# Patient Record
Sex: Male | Born: 1970
Health system: Southern US, Community
[De-identification: ages and names within clinical notes are randomized; demographics above are authoritative.]

## PROBLEM LIST (undated history)

## (undated) DIAGNOSIS — N200 Calculus of kidney: Secondary | ICD-10-CM

## (undated) DIAGNOSIS — I82409 Acute embolism and thrombosis of unspecified deep veins of unspecified lower extremity: Secondary | ICD-10-CM

## (undated) DIAGNOSIS — I2699 Other pulmonary embolism without acute cor pulmonale: Secondary | ICD-10-CM

## (undated) DIAGNOSIS — R7303 Prediabetes: Secondary | ICD-10-CM

## (undated) DIAGNOSIS — E785 Hyperlipidemia, unspecified: Secondary | ICD-10-CM

## (undated) DIAGNOSIS — K279 Peptic ulcer, site unspecified, unspecified as acute or chronic, without hemorrhage or perforation: Secondary | ICD-10-CM

## (undated) DIAGNOSIS — Z87442 Personal history of urinary calculi: Secondary | ICD-10-CM

## (undated) DIAGNOSIS — G4730 Sleep apnea, unspecified: Secondary | ICD-10-CM

## (undated) HISTORY — DX: Other pulmonary embolism without acute cor pulmonale: I26.99

## (undated) HISTORY — DX: Acute embolism and thrombosis of unspecified deep veins of unspecified lower extremity: I82.409

## (undated) HISTORY — DX: Peptic ulcer, site unspecified, unspecified as acute or chronic, without hemorrhage or perforation: K27.9

## (undated) HISTORY — DX: Sleep apnea, unspecified: G47.30

## (undated) HISTORY — DX: Hyperlipidemia, unspecified: E78.5

## (undated) HISTORY — DX: Calculus of kidney: N20.0

## (undated) HISTORY — PX: TONSILLECTOMY: SHX5217

## (undated) HISTORY — PX: TONSILLECTOMY: SUR1361

---

## 1997-09-30 ENCOUNTER — Other Ambulatory Visit: Admission: RE | Admit: 1997-09-30 | Discharge: 1997-09-30 | Payer: Self-pay | Admitting: *Deleted

## 2000-09-21 ENCOUNTER — Encounter: Payer: Self-pay | Admitting: Family Medicine

## 2001-07-17 ENCOUNTER — Ambulatory Visit (HOSPITAL_BASED_OUTPATIENT_CLINIC_OR_DEPARTMENT_OTHER): Admission: RE | Admit: 2001-07-17 | Discharge: 2001-07-17 | Payer: Self-pay | Admitting: Family Medicine

## 2007-07-03 ENCOUNTER — Encounter: Payer: Self-pay | Admitting: Family Medicine

## 2007-07-09 ENCOUNTER — Encounter: Payer: Self-pay | Admitting: Family Medicine

## 2009-04-01 ENCOUNTER — Ambulatory Visit: Payer: Self-pay | Admitting: Family Medicine

## 2009-04-01 DIAGNOSIS — E785 Hyperlipidemia, unspecified: Secondary | ICD-10-CM

## 2009-04-01 HISTORY — DX: Hyperlipidemia, unspecified: E78.5

## 2009-04-03 LAB — CONVERTED CEMR LAB
ALT: 26 units/L (ref 0–53)
AST: 22 units/L (ref 0–37)
Albumin: 4.3 g/dL (ref 3.5–5.2)
Eosinophils Relative: 2 % (ref 0.0–5.0)
GFR calc non Af Amer: 114.26 mL/min (ref >60)
HCT: 48.1 % (ref 39.0–52.0)
HDL: 37.9 mg/dL — ABNORMAL LOW (ref >39.00)
Hemoglobin: 16.1 g/dL (ref 13.0–17.0)
Lymphs Abs: 2.2 10*3/uL (ref 0.7–4.0)
Monocytes Relative: 7.9 % (ref 3.0–12.0)
Neutro Abs: 3.2 10*3/uL (ref 1.4–7.7)
Potassium: 4.2 meq/L (ref 3.5–5.1)
RBC: 5.44 M/uL (ref 4.22–5.81)
Sodium: 140 meq/L (ref 135–145)
TSH: 1.41 microintl units/mL (ref 0.35–5.50)
VLDL: 31 mg/dL (ref 0.0–40.0)
WBC: 6 10*3/uL (ref 4.5–10.5)

## 2009-06-02 ENCOUNTER — Encounter: Payer: Self-pay | Admitting: Family Medicine

## 2009-12-03 ENCOUNTER — Emergency Department (HOSPITAL_BASED_OUTPATIENT_CLINIC_OR_DEPARTMENT_OTHER): Admission: EM | Admit: 2009-12-03 | Discharge: 2009-12-03 | Payer: Self-pay | Admitting: Emergency Medicine

## 2010-04-06 NOTE — Letter (Signed)
Summary: [facility]   Imported By: Maryln Gottron 04/02/2009 15:01:10  _____________________________________________________________________  External Attachment:    Type:   Image     Comment:   External Document

## 2010-04-06 NOTE — Medication Information (Signed)
Summary: Order for CPAP Supplies/[facility]  Order for CPAP Supplies/[facility]   Imported By: Maryln Gottron 06/04/2009 10:44:52  _____________________________________________________________________  External Attachment:    Type:   Image     Comment:   External Document

## 2010-04-06 NOTE — Assessment & Plan Note (Signed)
Summary: NEW PT TO [facility]/TO ESTABLISH/PT REQ CPX/PT FASTING/[facility]   Vital Signs:  Patient profile:   40 year old male Height:      70.25 inches Weight:      250 pounds BMI:     35.75 Temp:     98.6 degrees F oral Pulse rate:   80 / minute Pulse rhythm:   regular Resp:     12 per minute BP sitting:   110 / 80  (left arm) Cuff size:   large  Vitals Entered By: Sid Falcon LPN (April 01, 2009 9:11 AM) CC: New pt to establish Is Patient Diabetic? No   History of Present Illness: New patient to establish care and for complete physical examination. Patient has no significant chronic medical problems other than obstructive sleep apnea. History of dyslipidemia with predominantly low HDL and remote history of kidney stones.  History tonsillectomy 1979. Allergy to penicillin. Takes no regular medications.  Family history significant for father and grandfather having diabetes and coronary artery disease. Hypertension several members. Grandmother with colon cancer. Sister with hypothyroidism. Aunt with melanoma.    Patient is married and has 2 children ages 74 and 65. Wife is a Chartered loss adjuster. Patient works with [employer] and has recently discovered that he may be losing his job. Has just recently started exercise.  Preventive Screening-Counseling & Management  Alcohol-Tobacco     Smoking Status: never  Caffeine-Diet-Exercise     Does Patient Exercise: no  Allergies (verified): 1)  Penicillin V Potassium (Penicillin V Potassium)  Past History:  Past Medical History:  Hyperlipidemia Kidney stones Sleep Apnea  Past Surgical History: Tonsillectomy38  Family History: Family History of Alcoholism/Addiction, father, grandfather,uncle colon cancer, grandmother prostate cancer, grandfather Family History High cholesterol, father & mother Heart disease, grandfather, father 5447s) Family History Hypertension, father, grandfather Sudden death, stroke, grandfather Diabetes,  father, grandfather Alzheimers, grandmother Hypothyroid, sister Melanoma, aunt  Social History: Occupation:  Emergency planning/management officer Married Never Smoked Alcohol use-no Regular exercise-no Smoking Status:  never Occupation:  employed Does Patient Exercise:  no  Review of Systems       The patient complains of weight gain.  The patient denies anorexia, fever, weight loss, vision loss, decreased hearing, hoarseness, chest pain, syncope, dyspnea on exertion, peripheral edema, prolonged cough, headaches, hemoptysis, abdominal pain, melena, hematochezia, severe indigestion/heartburn, hematuria, incontinence, genital sores, muscle weakness, suspicious skin lesions, transient blindness, depression, unusual weight change, enlarged lymph nodes, and testicular masses.         occasional lightheadedness when he first stands up no syncope.  Physical Exam  General:  Well-developed,well-nourished,in no acute distress; alert,appropriate and cooperative throughout examination Head:  Normocephalic and atraumatic without obvious abnormalities. No apparent alopecia or balding. Eyes:  No corneal or conjunctival inflammation noted. EOMI. Perrla. Funduscopic exam benign, without hemorrhages, exudates or papilledema. Vision grossly normal. Ears:  External ear exam shows no significant lesions or deformities.  Otoscopic examination reveals clear canals, tympanic membranes are intact bilaterally without bulging, retraction, inflammation or discharge. Hearing is grossly normal bilaterally. Nose:  External nasal examination shows no deformity or inflammation. Nasal mucosa are pink and moist without lesions or exudates. Mouth:  Oral mucosa and oropharynx without lesions or exudates.  Teeth in good repair. Neck:  No deformities, masses, or tenderness noted. Lungs:  Normal respiratory effort, chest expands symmetrically. Lungs are clear to auscultation, no crackles or wheezes. Heart:  Normal rate and regular rhythm. S1 and  S2 normal without gallop, murmur, click, rub or other extra sounds.  Abdomen:  Bowel sounds positive,abdomen soft and non-tender without masses, organomegaly or hernias noted. Genitalia:  Testes bilaterally descended without nodularity, tenderness or masses. No scrotal masses or lesions. No penis lesions or urethral discharge. Msk:  No deformity or scoliosis noted of thoracic or lumbar spine.   Extremities:  No clubbing, cyanosis, edema, or deformity noted with normal full range of motion of all joints.   Neurologic:  No cranial nerve deficits noted. Station and gait are normal. Plantar reflexes are down-going bilaterally. DTRs are symmetrical throughout. Sensory, motor and coordinative functions appear intact. Skin:  several moles on his back but no sig  atypical features. Cervical Nodes:  No lymphadenopathy noted Psych:  Cognition and judgment appear intact. Alert and cooperative with normal attention span and concentration. No apparent delusions, illusions, hallucinations   Impression & Recommendations:  Problem # 1:  Preventive Health Care (ICD-V70.0) patient is to work on weight loss. Immunizations up to date. Obtain screening lab work. Start regular exercise.  Other Orders: TLB-Lipid Panel (80061-LIPID) TLB-BMP (Basic Metabolic Panel-BMET) (80048-METABOL) TLB-CBC Platelet - w/Differential (85025-CBCD) TLB-Hepatic/Liver Function Pnl (80076-HEPATIC) TLB-TSH (Thyroid Stimulating Hormone) (84443-TSH) Venipuncture (25427)  Patient Instructions: 1)  It is important that you exercise reguarly at least 20 minutes 5 times a week. If you develop chest pain, have severe difficulty breathing, or feel very tired, stop exercising immediately and seek medical attention.  2)  You need to lose weight. Consider a lower calorie diet and regular exercise.   Preventive Care Screening  Last Tetanus Booster:    Date:  03/07/2002    Results:  Historical      Colonscopy 1995 (Hx  Ulcer)    Immunization History:  Hepatitis A Immunization History:    Hepatitis A # 1:  historical (03/08/1999)    Hepatitis A # 2:  historical (03/07/2002)

## 2010-05-20 LAB — URINE MICROSCOPIC-ADD ON

## 2010-05-20 LAB — URINALYSIS, ROUTINE W REFLEX MICROSCOPIC
Bilirubin Urine: NEGATIVE
Glucose, UA: NEGATIVE mg/dL
Ketones, ur: NEGATIVE mg/dL
Leukocytes, UA: NEGATIVE
Nitrite: NEGATIVE
Protein, ur: NEGATIVE mg/dL
Specific Gravity, Urine: 1.024 (ref 1.005–1.030)
Urobilinogen, UA: 0.2 mg/dL (ref 0.0–1.0)
pH: 5.5 (ref 5.0–8.0)

## 2010-08-26 ENCOUNTER — Other Ambulatory Visit: Payer: Self-pay

## 2010-09-02 ENCOUNTER — Other Ambulatory Visit (INDEPENDENT_AMBULATORY_CARE_PROVIDER_SITE_OTHER): Payer: BC Managed Care – PPO

## 2010-09-02 DIAGNOSIS — Z0000 Encounter for general adult medical examination without abnormal findings: Secondary | ICD-10-CM

## 2010-09-02 LAB — HEPATIC FUNCTION PANEL
Bilirubin, Direct: 0.1 mg/dL (ref 0.0–0.3)
Total Bilirubin: 0.6 mg/dL (ref 0.3–1.2)

## 2010-09-02 LAB — BASIC METABOLIC PANEL
CO2: 29 mEq/L (ref 19–32)
Calcium: 9.1 mg/dL (ref 8.4–10.5)
GFR: 118.55 mL/min (ref >60.00)
Sodium: 137 mEq/L (ref 135–145)

## 2010-09-02 LAB — CBC WITH DIFFERENTIAL/PLATELET
Basophils Relative: 0.6 % (ref 0.0–3.0)
Eosinophils Relative: 1.8 % (ref 0.0–5.0)
Hemoglobin: 16 g/dL (ref 13.0–17.0)
Lymphocytes Relative: 38.5 % (ref 12.0–46.0)
MCHC: 34.7 g/dL (ref 30.0–36.0)
Monocytes Relative: 6.2 % (ref 3.0–12.0)
Neutro Abs: 3.3 10*3/uL (ref 1.4–7.7)
RBC: 5.31 Mil/uL (ref 4.22–5.81)
WBC: 6.2 10*3/uL (ref 4.5–10.5)

## 2010-09-02 LAB — LIPID PANEL
HDL: 32.3 mg/dL — ABNORMAL LOW (ref >39.00)
Total CHOL/HDL Ratio: 6
Triglycerides: 325 mg/dL — ABNORMAL HIGH (ref 0.0–149.0)
VLDL: 65 mg/dL — ABNORMAL HIGH (ref 0.0–40.0)

## 2010-09-02 LAB — POCT URINALYSIS DIPSTICK
Nitrite, UA: NEGATIVE
Urobilinogen, UA: 0.2
pH, UA: 6

## 2010-09-02 LAB — TSH: TSH: 1.56 u[IU]/mL (ref 0.35–5.50)

## 2010-09-03 ENCOUNTER — Encounter: Payer: Self-pay | Admitting: Family Medicine

## 2010-09-09 ENCOUNTER — Encounter: Payer: Self-pay | Admitting: Family Medicine

## 2010-09-10 ENCOUNTER — Encounter: Payer: Self-pay | Admitting: Family Medicine

## 2010-09-13 ENCOUNTER — Encounter: Payer: Self-pay | Admitting: Family Medicine

## 2010-09-13 ENCOUNTER — Ambulatory Visit (INDEPENDENT_AMBULATORY_CARE_PROVIDER_SITE_OTHER): Payer: BC Managed Care – PPO | Admitting: Family Medicine

## 2010-09-13 VITALS — BP 122/82 | HR 80 | Temp 98.0°F | Resp 12 | Ht 70.0 in | Wt 250.0 lb

## 2010-09-13 DIAGNOSIS — Z0000 Encounter for general adult medical examination without abnormal findings: Secondary | ICD-10-CM

## 2010-09-13 DIAGNOSIS — Z23 Encounter for immunization: Secondary | ICD-10-CM

## 2010-09-13 NOTE — Patient Instructions (Signed)
Work on weight loss and more consistent exercise Reduce sugars and starches Consider omega-3 supplement such as fish oil 2-3 g daily.

## 2010-09-13 NOTE — Progress Notes (Signed)
  Subjective:    Patient ID: Scott Mcconnell, male    DOB: 05-22-1970, 40 y.o.   MRN: [id number]  HPI Patient seen for well visit. He has no chronic problems other than some dyslipidemia with mostly high triglycerides. Takes no medications. Allergy to penicillin. Last tetanus 2004. No history of pertussis booster to our knowledge. He has obstructive sleep apnea and uses CPAP for that. No consistent exercise. Also past history of kidney stones.  Past Medical History  Diagnosis Date  . Hyperlipidemia   . Kidney stones   . Sleep apnea   . HYPERLIPIDEMIA 04/01/2009  . Sleep apnea   . Kidney stones    Past Surgical History  Procedure Date  . Tonsillectomy   . Tonsillectomy     reports that he has never smoked. He does not have any smokeless tobacco history on file. He reports that he does not drink alcohol. His drug history not on file. family history includes Alcohol abuse in his father, paternal grandfather, and paternal uncle; Alzheimer's disease in an unspecified family member; Cancer in an unspecified family member; Diabetes in his father; Heart disease in his father; Hyperlipidemia in his father and mother; Hypertension in his father; Hypothyroidism in his sister; Stroke in an unspecified family member; and Sudden death in an unspecified family member. Allergies  Allergen Reactions  . Penicillins     As a child      Review of Systems  Constitutional: Negative for fever, activity change, appetite change and fatigue.  HENT: Negative for ear pain, congestion and trouble swallowing.   Eyes: Negative for pain and visual disturbance.  Respiratory: Negative for cough, shortness of breath and wheezing.   Cardiovascular: Negative for chest pain and palpitations.  Gastrointestinal: Negative for nausea, vomiting, abdominal pain, diarrhea, constipation, blood in stool, abdominal distention and rectal pain.  Genitourinary: Negative for dysuria, hematuria and testicular pain.  Musculoskeletal:  Negative for joint swelling and arthralgias.  Skin: Negative for rash.  Neurological: Negative for dizziness, syncope and headaches.  Hematological: Negative for adenopathy.  Psychiatric/Behavioral: Negative for confusion and dysphoric mood.       Objective:   Physical Exam  Constitutional: He is oriented to person, place, and time. He appears well-developed and well-nourished. No distress.  HENT:  Head: Normocephalic and atraumatic.  Right Ear: External ear normal.  Left Ear: External ear normal.  Mouth/Throat: Oropharynx is clear and moist.  Eyes: Conjunctivae and EOM are normal. Pupils are equal, round, and reactive to light.  Neck: Normal range of motion. Neck supple. No thyromegaly present.  Cardiovascular: Normal rate, regular rhythm and normal heart sounds.   No murmur heard. Pulmonary/Chest: No respiratory distress. He has no wheezes. He has no rales.  Abdominal: Soft. Bowel sounds are normal. He exhibits no distension and no mass. There is no tenderness. There is no rebound and no guarding.  Musculoskeletal: He exhibits no edema.  Lymphadenopathy:    He has no cervical adenopathy.  Neurological: He is alert and oriented to person, place, and time. He displays normal reflexes. No cranial nerve deficit.  Skin: No rash noted.  Psychiatric: He has a normal mood and affect.          Assessment & Plan:  Health maintenance. Tetanus booster given. He has hypertriglyceridemia. Recommended supplement with fish oil and reduction of weight and sugars and starches. Establish more regular exercise.  He has substantial risk for metabolic syndrome and very high risk for eventual diabetes. Prevention issues discussed

## 2011-12-02 ENCOUNTER — Other Ambulatory Visit (INDEPENDENT_AMBULATORY_CARE_PROVIDER_SITE_OTHER): Payer: 59

## 2011-12-02 DIAGNOSIS — Z0000 Encounter for general adult medical examination without abnormal findings: Secondary | ICD-10-CM

## 2011-12-02 LAB — BASIC METABOLIC PANEL
BUN: 11 mg/dL (ref 6–23)
Calcium: 9.1 mg/dL (ref 8.4–10.5)
GFR: 108.06 mL/min (ref >60.00)
Glucose, Bld: 86 mg/dL (ref 70–99)
Potassium: 4.1 mEq/L (ref 3.5–5.1)

## 2011-12-02 LAB — CBC WITH DIFFERENTIAL/PLATELET
Basophils Absolute: 0 10*3/uL (ref 0.0–0.1)
Eosinophils Absolute: 0.1 10*3/uL (ref 0.0–0.7)
HCT: 48.7 % (ref 39.0–52.0)
Lymphs Abs: 2.4 10*3/uL (ref 0.7–4.0)
MCHC: 32.8 g/dL (ref 30.0–36.0)
Monocytes Relative: 7 % (ref 3.0–12.0)
Platelets: 227 10*3/uL (ref 150.0–400.0)
RDW: 12.8 % (ref 11.5–14.6)

## 2011-12-02 LAB — LDL CHOLESTEROL, DIRECT: Direct LDL: 133.1 mg/dL

## 2011-12-02 LAB — POCT URINALYSIS DIPSTICK
Bilirubin, UA: NEGATIVE
Blood, UA: NEGATIVE
Glucose, UA: NEGATIVE
Ketones, UA: NEGATIVE
Spec Grav, UA: 1.02
Urobilinogen, UA: 0.2

## 2011-12-02 LAB — HEPATIC FUNCTION PANEL
ALT: 24 U/L (ref 0–53)
AST: 18 U/L (ref 0–37)
Albumin: 4.1 g/dL (ref 3.5–5.2)
Alkaline Phosphatase: 43 U/L (ref 39–117)
Bilirubin, Direct: 0 mg/dL (ref 0.0–0.3)
Total Bilirubin: 0.8 mg/dL (ref 0.3–1.2)
Total Protein: 7.6 g/dL (ref 6.0–8.3)

## 2011-12-02 LAB — LIPID PANEL
Cholesterol: 220 mg/dL — ABNORMAL HIGH (ref 0–200)
VLDL: 40.2 mg/dL — ABNORMAL HIGH (ref 0.0–40.0)

## 2011-12-02 LAB — TSH: TSH: 2.48 u[IU]/mL (ref 0.35–5.50)

## 2011-12-09 ENCOUNTER — Encounter: Payer: BC Managed Care – PPO | Admitting: Family Medicine

## 2011-12-12 ENCOUNTER — Encounter: Payer: Self-pay | Admitting: Family Medicine

## 2011-12-12 ENCOUNTER — Ambulatory Visit (INDEPENDENT_AMBULATORY_CARE_PROVIDER_SITE_OTHER): Payer: 59 | Admitting: Family Medicine

## 2011-12-12 VITALS — BP 110/80 | HR 72 | Temp 98.2°F | Resp 12 | Ht 70.5 in | Wt 254.0 lb

## 2011-12-12 DIAGNOSIS — Z0000 Encounter for general adult medical examination without abnormal findings: Secondary | ICD-10-CM

## 2011-12-12 DIAGNOSIS — Z23 Encounter for immunization: Secondary | ICD-10-CM

## 2011-12-12 NOTE — Patient Instructions (Addendum)
Continue regular exercise and consider increasing intensity of exercise as discussed.

## 2011-12-12 NOTE — Progress Notes (Signed)
Subjective:    Patient ID: Scott Mcconnell, male    DOB: 1970/08/05, 41 y.o.   MRN: [id number]  HPI  Patient seen for complete physical. He has history of obesity and hyperlipidemia. Strong family history of hyperlipidemia, type 2 diabetes, and CAD. Patient has started walking some recently for exercise. No recent chest pains. Weight is essentially unchanged from last year. He takes no medications. Nonsmoker. No history of diabetes. No history of hypertension. Needs flu vaccine.  Past Medical History  Diagnosis Date  . Hyperlipidemia   . Kidney stones   . Sleep apnea   . HYPERLIPIDEMIA 04/01/2009  . Sleep apnea   . Kidney stones    Past Surgical History  Procedure Date  . Tonsillectomy   . Tonsillectomy     reports that he has never smoked. He does not have any smokeless tobacco history on file. He reports that he does not drink alcohol. His drug history not on file. family history includes Alcohol abuse in his father, paternal grandfather, and paternal uncle; Alzheimer's disease in an unspecified family member; Cancer in an unspecified family member; Diabetes in his father; Heart disease (age of onset:55) in his father; Hyperlipidemia in his father and mother; Hypertension in his father; Hypothyroidism in his sister; Stroke in an unspecified family member; and Sudden death in an unspecified family member. Allergies  Allergen Reactions  . Penicillins     As a child      Review of Systems  Constitutional: Negative for fever, activity change, appetite change and fatigue.  HENT: Negative for ear pain, congestion and trouble swallowing.   Eyes: Negative for pain and visual disturbance.  Respiratory: Negative for cough, shortness of breath and wheezing.   Cardiovascular: Negative for chest pain and palpitations.  Gastrointestinal: Negative for nausea, vomiting, abdominal pain, diarrhea, constipation, blood in stool, abdominal distention and rectal pain.  Genitourinary: Negative for  dysuria, hematuria and testicular pain.  Musculoskeletal: Negative for joint swelling and arthralgias.       Patient's had some right lateral elbow pain. Job requires frequent typing. Location is lateral epicondylar region. Pain with gripping. No weakness. No visible swelling  Skin: Negative for rash.  Neurological: Negative for dizziness, syncope and headaches.  Hematological: Negative for adenopathy.  Psychiatric/Behavioral: Negative for confusion and dysphoric mood.       Objective:   Physical Exam  Constitutional: He is oriented to person, place, and time. He appears well-developed and well-nourished. No distress.  HENT:  Head: Normocephalic and atraumatic.  Right Ear: External ear normal.  Left Ear: External ear normal.  Mouth/Throat: Oropharynx is clear and moist.  Eyes: Conjunctivae normal and EOM are normal. Pupils are equal, round, and reactive to light.  Neck: Normal range of motion. Neck supple. No thyromegaly present.  Cardiovascular: Normal rate, regular rhythm and normal heart sounds.   No murmur heard. Pulmonary/Chest: No respiratory distress. He has no wheezes. He has no rales.  Abdominal: Soft. Bowel sounds are normal. He exhibits no distension and no mass. There is no tenderness. There is no rebound and no guarding.  Musculoskeletal: He exhibits no edema.       Full range of motion right elbow. Tender right lateral epicondylar region  Lymphadenopathy:    He has no cervical adenopathy.  Neurological: He is alert and oriented to person, place, and time. He displays normal reflexes. No cranial nerve deficit.  Skin: No rash noted.  Psychiatric: He has a normal mood and affect.  Assessment & Plan:  Complete physical. Labs reviewed with patient. Low HDL and mildly elevated LDL. Probable metabolic syndrome with elevated triglycerides, low HDL, and increased waist circumference. Discussed strategies for weight loss. Step up his exercise. Flu vaccine  given  Right lateral epicondylitis.  Pt will try icing and tennis elbow strap.

## 2012-02-08 ENCOUNTER — Other Ambulatory Visit: Payer: Self-pay | Admitting: Family Medicine

## 2012-02-08 DIAGNOSIS — G4733 Obstructive sleep apnea (adult) (pediatric): Secondary | ICD-10-CM | POA: Insufficient documentation

## 2012-02-08 NOTE — Telephone Encounter (Signed)
Pt has obstructive sleep apnea 327.23

## 2012-02-08 NOTE — Telephone Encounter (Signed)
Rx written, problem list does not include a diagnosis for CPAP.  I will need code for insurance purposes. Please assist

## 2012-02-08 NOTE — Telephone Encounter (Signed)
Pt needs new CPaP machine, and all supplies that go with it. (mask, hose). (Has 42 yr old one now.) Pt insurance's com. said they will pay.  [facility] needs a script. Pt would like to use [facility] at [address], [place], [place] [phone])

## 2012-02-09 NOTE — Telephone Encounter (Signed)
Rx faxed to [facility], [phone]

## 2012-11-12 ENCOUNTER — Other Ambulatory Visit (INDEPENDENT_AMBULATORY_CARE_PROVIDER_SITE_OTHER): Payer: 59

## 2012-11-12 DIAGNOSIS — Z0000 Encounter for general adult medical examination without abnormal findings: Secondary | ICD-10-CM

## 2012-11-12 LAB — LIPID PANEL
Cholesterol: 223 mg/dL — ABNORMAL HIGH (ref 0–200)
HDL: 38.9 mg/dL — ABNORMAL LOW (ref >39.00)
Total CHOL/HDL Ratio: 6
VLDL: 52.8 mg/dL — ABNORMAL HIGH (ref 0.0–40.0)

## 2012-11-12 LAB — CBC WITH DIFFERENTIAL/PLATELET
Basophils Relative: 0.6 % (ref 0.0–3.0)
Eosinophils Relative: 2 % (ref 0.0–5.0)
Lymphocytes Relative: 38.6 % (ref 12.0–46.0)
MCV: 84.1 fl (ref 78.0–100.0)
Monocytes Absolute: 0.4 10*3/uL (ref 0.1–1.0)
Monocytes Relative: 6.9 % (ref 3.0–12.0)
Neutrophils Relative %: 51.9 % (ref 43.0–77.0)
Platelets: 239 10*3/uL (ref 150.0–400.0)
RBC: 5.8 Mil/uL (ref 4.22–5.81)
WBC: 6.3 10*3/uL (ref 4.5–10.5)

## 2012-11-12 LAB — HEPATIC FUNCTION PANEL
ALT: 26 U/L (ref 0–53)
Alkaline Phosphatase: 43 U/L (ref 39–117)
Bilirubin, Direct: 0.1 mg/dL (ref 0.0–0.3)
Total Bilirubin: 0.9 mg/dL (ref 0.3–1.2)
Total Protein: 7.8 g/dL (ref 6.0–8.3)

## 2012-11-12 LAB — BASIC METABOLIC PANEL
BUN: 14 mg/dL (ref 6–23)
Calcium: 9.3 mg/dL (ref 8.4–10.5)
Chloride: 102 mEq/L (ref 96–112)
Creatinine, Ser: 0.8 mg/dL (ref 0.4–1.5)
GFR: 119.08 mL/min (ref >60.00)

## 2012-11-12 LAB — POCT URINALYSIS DIPSTICK
Bilirubin, UA: NEGATIVE
Ketones, UA: NEGATIVE
Leukocytes, UA: NEGATIVE
Spec Grav, UA: 1.025
pH, UA: 6

## 2012-11-26 ENCOUNTER — Ambulatory Visit (INDEPENDENT_AMBULATORY_CARE_PROVIDER_SITE_OTHER): Payer: 59 | Admitting: Family Medicine

## 2012-11-26 ENCOUNTER — Encounter: Payer: Self-pay | Admitting: Family Medicine

## 2012-11-26 VITALS — BP 120/80 | HR 85 | Temp 98.3°F | Ht 70.0 in | Wt 258.0 lb

## 2012-11-26 DIAGNOSIS — E669 Obesity, unspecified: Secondary | ICD-10-CM | POA: Insufficient documentation

## 2012-11-26 DIAGNOSIS — Z23 Encounter for immunization: Secondary | ICD-10-CM

## 2012-11-26 DIAGNOSIS — Z0000 Encounter for general adult medical examination without abnormal findings: Secondary | ICD-10-CM

## 2012-11-26 MED ORDER — MOMETASONE FUROATE 0.1 % EX SOLN: Freq: Every day | CUTANEOUS | Status: DC

## 2012-11-26 NOTE — Progress Notes (Signed)
Subjective:    Patient ID: Scott Mcconnell, male    DOB: [birth year], 42 y.o.   MRN: [id number]  HPI Patient here for complete physical Has history of obesity, dyslipidemia, and obstructive sleep apnea. Uses CPAP regularly. Denies any problems with this. Unfortunately, just lost his job recently. Dealing with stress issues with that. Currently not exercising. Weight is stable.  Intermittent right low back pain. No consistent radiculopathy symptoms. No numbness or weakness. Recurrent problems with left ear itching. No drainage  Past Medical History  Diagnosis Date  . Hyperlipidemia   . Kidney stones   . Sleep apnea   . HYPERLIPIDEMIA 04/01/2009  . Sleep apnea   . Kidney stones    Past Surgical History  Procedure Laterality Date  . Tonsillectomy    . Tonsillectomy      reports that he has never smoked. He does not have any smokeless tobacco history on file. He reports that he does not drink alcohol. His drug history is not on file. family history includes Alcohol abuse in his father, paternal grandfather, and paternal uncle; Alzheimer's disease in an other family member; Cancer in an other family member; Diabetes in his father; Heart disease (age of onset: 85) in his father; Hyperlipidemia in his father and mother; Hypertension in his father; Hypothyroidism in his sister; Stroke in an other family member; Sudden death in an other family member. Allergies  Allergen Reactions  . Penicillins     As a child      Review of Systems  Constitutional: Negative for fever, activity change, appetite change and fatigue.  HENT: Negative for ear pain, congestion and trouble swallowing.   Eyes: Negative for pain and visual disturbance.  Respiratory: Negative for cough, shortness of breath and wheezing.   Cardiovascular: Negative for chest pain and palpitations.  Gastrointestinal: Negative for nausea, vomiting, abdominal pain, diarrhea, constipation, blood in stool, abdominal distention and  rectal pain.  Genitourinary: Negative for dysuria, hematuria and testicular pain.  Musculoskeletal: Positive for back pain. Negative for myalgias, joint swelling and arthralgias.  Skin: Negative for rash.  Neurological: Negative for dizziness, syncope and headaches.  Hematological: Negative for adenopathy.  Psychiatric/Behavioral: Negative for confusion and dysphoric mood.       Objective:   Physical Exam  Constitutional: He is oriented to person, place, and time. He appears well-developed and well-nourished. No distress.  HENT:  Right Ear: External ear normal.  Mouth/Throat: Oropharynx is clear and moist.  Left canal reveals no cerumen. Minimal scaling and minimal erythema  Eyes: Pupils are equal, round, and reactive to light.  Neck: Neck supple. No thyromegaly present.  Cardiovascular: Normal rate and regular rhythm.   Pulmonary/Chest: Effort normal and breath sounds normal. No respiratory distress. He has no wheezes. He has no rales.  Abdominal: Soft. Bowel sounds are normal. There is no tenderness. There is no rebound and no guarding.  Patient has fairly large ventral hernia which is nontender. Small umbilical hernia which also is nontender  Genitourinary:  Testes are normal  Musculoskeletal: He exhibits no edema.  Lymphadenopathy:    He has no cervical adenopathy.  Neurological: He is alert and oriented to person, place, and time. No cranial nerve deficit.  Skin: Skin is warm and dry. No rash noted. No erythema. No pallor.  Psychiatric: He has a normal mood and affect. His behavior is normal. Judgment and thought content normal.          Assessment & Plan:  Complete physical. Labs reviewed. We've recommended weight  loss and discussed strategies. Flu vaccine given. He has mild eczema involving left ear canal. Elocon lotion once daily as needed. We talked about effective strategies for dealing with stress

## 2012-11-26 NOTE — Patient Instructions (Addendum)
Ventral Hernia A ventral hernia (also called an incisional hernia) is a hernia that occurs at the site of a previous surgical cut (incision) in the abdomen. The abdominal wall spans from your lower chest down to your pelvis. If the abdominal wall is weakened from a surgical incision, a hernia can occur. A hernia is a bulge of bowel or muscle tissue pushing out on the weakened part of the abdominal wall. Ventral hernias can get bigger from straining or lifting. Obese and older people are at higher risk for a ventral hernia. People who develop infections after surgery or require repeat incisions at the same site on the abdomen are also at increased risk. CAUSES  A ventral hernia occurs because of weakness in the abdominal wall at an incision site.  SYMPTOMS  Common symptoms include:  A visible bulge or lump on the abdominal wall.  Pain or tenderness around the lump.  Increased discomfort if you cough or make a sudden movement. If the hernia has blocked part of the intestine, a serious complication can occur (incarcerated or strangulated hernia). This can become a problem that requires emergency surgery because the blood flow to the blocked intestine may be cut off. Symptoms may include:  Feeling sick to your stomach (nauseous).  Throwing up (vomiting).  Stomach swelling (distention) or bloating.  Fever.  Rapid heartbeat. DIAGNOSIS  Your caregiver will take a medical history and perform a physical exam. Various tests may be ordered, such as:  Blood tests.  Urine tests.  Ultrasonography.  X-rays.  Computed tomography (CT). TREATMENT  Watchful waiting may be all that is needed for a smaller hernia that does not cause symptoms. Your caregiver may recommend the use of a supportive belt (truss) that helps to keep the abdominal wall intact. For larger hernias or those that cause pain, surgery to repair the hernia is usually recommended. If a hernia becomes strangulated, emergency surgery  needs to be done right away. HOME CARE INSTRUCTIONS  Avoid putting pressure or strain on the abdominal area.  Avoid heavy lifting.  Use good body positioning for physical tasks. Ask your caregiver about proper body positioning.  Use a supportive belt as directed by your caregiver.  Maintain a healthy weight.  Eat foods that are high in fiber, such as whole grains, fruits, and vegetables. Fiber helps prevent difficult bowel movements (constipation).  Drink enough fluids to keep your urine clear or pale yellow.  Follow up with your caregiver as directed. SEEK MEDICAL CARE IF:   Your hernia seems to be getting larger or more painful. SEEK IMMEDIATE MEDICAL CARE IF:   You have abdominal pain that is sudden and sharp.  Your pain becomes severe.  You have repeated vomiting.  You are sweating a lot.  You notice a rapid heartbeat.  You develop a fever. MAKE SURE YOU:   Understand these instructions.  Will watch your condition.  Will get help right away if you are not doing well or get worse. Document Released: 02/08/2012 Document Reviewed: 01/27/2012 [employer] Patient Information 2014 [employer].  

## 2012-12-05 ENCOUNTER — Telehealth: Payer: Self-pay

## 2012-12-05 NOTE — Telephone Encounter (Signed)
Patient is calling about his eye, patient was given mometasone (ELOCON) 0.1 % lotion apply once daily. Patient wants to know is he suppose to put the drops in his ear because he thought he was suppose to getting the drops instead of a lotion.

## 2012-12-05 NOTE — Telephone Encounter (Signed)
Please clarify note.  NOT eye.  He is supposed to use in ear.  OK to use lotion-unless this is too thick to run into canal.

## 2012-12-06 NOTE — Telephone Encounter (Signed)
Pt informed

## 2013-11-20 ENCOUNTER — Other Ambulatory Visit (INDEPENDENT_AMBULATORY_CARE_PROVIDER_SITE_OTHER): Payer: BC Managed Care – PPO

## 2013-11-20 DIAGNOSIS — Z0000 Encounter for general adult medical examination without abnormal findings: Secondary | ICD-10-CM

## 2013-11-20 LAB — HEPATIC FUNCTION PANEL
ALBUMIN: 4.3 g/dL (ref 3.5–5.2)
ALK PHOS: 43 U/L (ref 39–117)
ALT: 25 U/L (ref 0–53)
AST: 23 U/L (ref 0–37)
Bilirubin, Direct: 0 mg/dL (ref 0.0–0.3)
TOTAL PROTEIN: 8 g/dL (ref 6.0–8.3)
Total Bilirubin: 1.1 mg/dL (ref 0.2–1.2)

## 2013-11-20 LAB — POCT URINALYSIS DIPSTICK
Bilirubin, UA: NEGATIVE
Blood, UA: NEGATIVE
GLUCOSE UA: NEGATIVE
Ketones, UA: NEGATIVE
Leukocytes, UA: NEGATIVE
NITRITE UA: NEGATIVE
Protein, UA: NEGATIVE
SPEC GRAV UA: 1.015
UROBILINOGEN UA: 0.2
pH, UA: 7

## 2013-11-20 LAB — CBC WITH DIFFERENTIAL/PLATELET
Basophils Absolute: 0 10*3/uL (ref 0.0–0.1)
Basophils Relative: 0.4 % (ref 0.0–3.0)
Eosinophils Absolute: 0.2 10*3/uL (ref 0.0–0.7)
Eosinophils Relative: 2.7 % (ref 0.0–5.0)
HCT: 48.3 % (ref 39.0–52.0)
Hemoglobin: 16.5 g/dL (ref 13.0–17.0)
LYMPHS PCT: 38.2 % (ref 12.0–46.0)
Lymphs Abs: 2.4 10*3/uL (ref 0.7–4.0)
MCHC: 34.3 g/dL (ref 30.0–36.0)
MCV: 85.6 fl (ref 78.0–100.0)
Monocytes Absolute: 0.4 10*3/uL (ref 0.1–1.0)
Monocytes Relative: 5.8 % (ref 3.0–12.0)
NEUTROS PCT: 52.9 % (ref 43.0–77.0)
Neutro Abs: 3.3 10*3/uL (ref 1.4–7.7)
Platelets: 234 10*3/uL (ref 150.0–400.0)
RBC: 5.64 Mil/uL (ref 4.22–5.81)
RDW: 13.1 % (ref 11.5–15.5)
WBC: 6.3 10*3/uL (ref 4.0–10.5)

## 2013-11-20 LAB — BASIC METABOLIC PANEL
BUN: 15 mg/dL (ref 6–23)
CO2: 30 mEq/L (ref 19–32)
Calcium: 9.2 mg/dL (ref 8.4–10.5)
Chloride: 103 mEq/L (ref 96–112)
Creatinine, Ser: 1.1 mg/dL (ref 0.4–1.5)
GFR: 80.73 mL/min (ref >60.00)
Glucose, Bld: 98 mg/dL (ref 70–99)
POTASSIUM: 4.9 meq/L (ref 3.5–5.1)
SODIUM: 140 meq/L (ref 135–145)

## 2013-11-20 LAB — LIPID PANEL
CHOLESTEROL: 206 mg/dL — AB (ref 0–200)
HDL: 32.9 mg/dL — AB (ref >39.00)
NonHDL: 173.1
TRIGLYCERIDES: 221 mg/dL — AB (ref 0.0–149.0)
Total CHOL/HDL Ratio: 6
VLDL: 44.2 mg/dL — AB (ref 0.0–40.0)

## 2013-11-20 LAB — TSH: TSH: 0.44 u[IU]/mL (ref 0.35–4.50)

## 2013-11-21 LAB — LDL CHOLESTEROL, DIRECT: LDL DIRECT: 145 mg/dL

## 2013-11-27 ENCOUNTER — Ambulatory Visit (INDEPENDENT_AMBULATORY_CARE_PROVIDER_SITE_OTHER): Payer: BC Managed Care – PPO | Admitting: Family Medicine

## 2013-11-27 ENCOUNTER — Encounter: Payer: Self-pay | Admitting: Family Medicine

## 2013-11-27 VITALS — BP 110/80 | HR 76 | Temp 98.0°F | Ht 70.0 in | Wt 256.0 lb

## 2013-11-27 DIAGNOSIS — Z0000 Encounter for general adult medical examination without abnormal findings: Secondary | ICD-10-CM

## 2013-11-27 DIAGNOSIS — Z23 Encounter for immunization: Secondary | ICD-10-CM

## 2013-11-27 NOTE — Patient Instructions (Signed)
Metabolic Syndrome, Adult Metabolic syndrome describes a group of risk factors for heart disease and diabetes. This syndrome has other names including Insulin Resistance Syndrome. The more risk factors you have, the higher your risk of having a heart attack, stroke, or developing diabetes. These risk factors include:  High blood sugar.  High blood triglyceride (a fat found in the blood) level.  High blood pressure.  Abdominal obesity (your extra weight is around your waist instead of your hips).  Low levels of high-density lipoprotein, HDL (good blood cholesterol). If you have any three of these risk factors, you have metabolic syndrome. If you have even one of these factors, you should make lifestyle changes to improve your health in order to prevent serious health diseases.  In people with metabolic syndrome, the cells do not respond properly to insulin. This can lead to high levels of glucose in the blood, which can interfere with normal body processes. Eventually, this can cause high blood pressure and higher fat levels in the blood, and inflammation of your blood vessels. The result can be heart disease and stroke.  CAUSES   Eating a diet rich in calories and saturated fat.  Too little physical activity.  Being overweight. Other underlying causes are:  Family history (genetics).  Ethnicity (South Asians are at a higher risk).  Older age (your chances of developing metabolic syndrome are higher as you grow older).  Insulin resistance. SYMPTOMS  By itself, metabolic syndrome has no symptoms. However, you might have symptoms of diabetes (high blood sugar) or high blood pressure, such as:  Increased thirst, urination, and tiredness.  Dizzy spells.  Dull headaches that are unusual for you.  Blurred vision.  Nosebleeds. DIAGNOSIS  Your caregiver may make a diagnosis of metabolic syndrome if you have at least three of these factors:  If you are overweight mostly around the  waist. This means a waistline greater than 40" in men and more than 35" in women. The waistline limits are 31 to 35 inches for women and 37 to 39 inches for men. In those who have certain genetic risk factors, such as having a family history of diabetes or being of Asian descent.  If you have a blood pressure of 130/85 mm Hg or more, or if you are being treated for high blood pressure.  If your blood triglyceride level is 150 mg/dL or more, or you are being treated for high levels of triglyceride.  If the level of HDL in your blood is below 40 mg/dL in men, less than 50 mg/dL in women, or you are receiving treatment for low levels of HDL.  If the level of sugar in your blood is high with fasting blood sugar level of 110 mg/dL or more, or you are under treatment for diabetes. TREATMENT  Your caregiver may have you make lifestyle changes, which may include:  Exercise.  Losing weight.  Maintaining a healthy diet.  Quitting smoking. The lifestyle changes listed above are key in reducing your risk for heart disease and stroke. Medicines may also be prescribed to help your body respond to insulin better and to reduce your blood pressure and blood fat levels. Aspirin may be recommended to reduce risks of heart disease or stroke.  HOME CARE INSTRUCTIONS   Exercise.  Measure your waist at regular intervals just above the hipbones after you have breathed out.  Maintain a healthy diet.  Eat fruits, such as apples, oranges, and pears.  Eat vegetables.  Eat legumes, such as kidney   beans, peas, and lentils.  Eat food rich in soluble fiber, such as whole grain cereal, oatmeal, and oat bran.  Use olive or safflower oils and avoid saturated fats.  Eat nuts.  Limit the amount of salt you eat or add to food.  Limit the amount of alcohol you drink.  Include fish in your diet, if possible.  Stop smoking if you are a smoker.  Maintain regular follow-up appointments.  Follow your  caregiver's advice. SEEK MEDICAL CARE IF:   You feel very tired or fatigued.  You develop excessive thirst.  You pass large quantities of urine.  You are putting on weight around your waist rather than losing weight.  You develop headaches over and over again.  You have off-and-on dizzy spells. SEEK IMMEDIATE MEDICAL CARE IF:   You develop nosebleeds.  You develop sudden blurred vision.  You develop sudden dizzy spells.  You develop chest pains, trouble breathing, or feel an abnormal or irregular heart beat.  You have a fainting episode.  You develop any sudden trouble speaking and/or swallowing.  You develop sudden weakness in one arm and/or one leg. MAKE SURE YOU:   Understand these instructions.  Will watch your condition.  Will get help right away if you are not doing well or get worse. Document Released: 05/31/2007 Document Revised: 07/08/2013 Document Reviewed: 05/31/2007 [facility] Patient Information 2015 [place], [place]. This information is not intended to replace advice given to you by your health care provider. Make sure you discuss any questions you have with your health care provider.  Continue with exercise and weight loss efforts.

## 2013-11-27 NOTE — Progress Notes (Signed)
Pre visit review using our clinic review tool, if applicable. No additional management support is needed unless otherwise documented below in the visit note. 

## 2013-11-27 NOTE — Progress Notes (Signed)
Subjective:    Patient ID: Scott Mcconnell, male    DOB: [birth year], 43 y.o.   MRN: [id number]  HPI Patient seen for complete physical. He has obstructive sleep apnea on CPAP. He has metabolic syndrome with high triglycerides, low HDL, and increased waist circumference. He has lost 2 pounds since last year. Started walking more recently. Needs flu vaccine. Tetanus up-to-date. Generally feels well.  Only acute issue is intermittent right foot pain. Mostly third distal metatarsal. Had some pain last week but none over the past few days. No swelling. No change of shoe wear. No injury.  Past Medical History  Diagnosis Date  . Hyperlipidemia   . Kidney stones   . Sleep apnea   . HYPERLIPIDEMIA 04/01/2009  . Sleep apnea   . Kidney stones    Past Surgical History  Procedure Laterality Date  . Tonsillectomy    . Tonsillectomy      reports that he has never smoked. He does not have any smokeless tobacco history on file. He reports that he does not drink alcohol. His drug history is not on file. family history includes Alcohol abuse in his father, paternal grandfather, and paternal uncle; Alzheimer's disease in an other family member; Cancer in an other family member; Diabetes in his father; Heart disease (age of onset: 22) in his father; Hyperlipidemia in his father and mother; Hypertension in his father; Hypothyroidism in his sister; Stroke in an other family member; Sudden death in an other family member. Allergies  Allergen Reactions  . Penicillins     As a child      Review of Systems  Constitutional: Negative for fever, activity change, appetite change and fatigue.  HENT: Negative for congestion, ear pain and trouble swallowing.   Eyes: Negative for pain and visual disturbance.  Respiratory: Negative for cough, shortness of breath and wheezing.   Cardiovascular: Negative for chest pain and palpitations.  Gastrointestinal: Negative for nausea, vomiting, abdominal pain, diarrhea,  constipation, blood in stool, abdominal distention and rectal pain.  Genitourinary: Negative for dysuria, hematuria and testicular pain.  Musculoskeletal: Negative for arthralgias and joint swelling.  Skin: Negative for rash.  Neurological: Negative for dizziness, syncope and headaches.  Hematological: Negative for adenopathy.  Psychiatric/Behavioral: Negative for confusion and dysphoric mood.       Objective:   Physical Exam  Constitutional: He is oriented to person, place, and time. He appears well-developed and well-nourished. No distress.  HENT:  Head: Normocephalic and atraumatic.  Right Ear: External ear normal.  Left Ear: External ear normal.  Mouth/Throat: Oropharynx is clear and moist.  Eyes: Conjunctivae and EOM are normal. Pupils are equal, round, and reactive to light.  Neck: Normal range of motion. Neck supple. No thyromegaly present.  Cardiovascular: Normal rate, regular rhythm and normal heart sounds.   No murmur heard. Pulmonary/Chest: No respiratory distress. He has no wheezes. He has no rales.  Abdominal: Soft. Bowel sounds are normal. He exhibits no distension and no mass. There is no tenderness. There is no rebound and no guarding.  Musculoskeletal: He exhibits no edema.  Right foot reveals no localized tenderness present. No visible edema. No erythema.  Lymphadenopathy:    He has no cervical adenopathy.  Neurological: He is alert and oriented to person, place, and time. He displays normal reflexes. No cranial nerve deficit.  Skin: No rash noted.  Psychiatric: He has a normal mood and affect.          Assessment & Plan:  Complete physical.  Patient has metabolic syndrome with increased waist circumference, increased triglycerides, and low HDL. He does not have any hyperglycemia or high blood pressure. We have strongly advocated further weight loss. Continue CPAP. Flu vaccine given. Consider x-ray right foot for any recurrent foot pain.

## 2014-03-07 ENCOUNTER — Emergency Department (HOSPITAL_BASED_OUTPATIENT_CLINIC_OR_DEPARTMENT_OTHER)
Admission: EM | Admit: 2014-03-07 | Discharge: 2014-03-07 | Disposition: A | Discharge status: Home / Self Care | Payer: BC Managed Care – PPO | Attending: Emergency Medicine | Admitting: Emergency Medicine

## 2014-03-07 ENCOUNTER — Emergency Department (HOSPITAL_BASED_OUTPATIENT_CLINIC_OR_DEPARTMENT_OTHER): Payer: BC Managed Care – PPO

## 2014-03-07 ENCOUNTER — Encounter (HOSPITAL_BASED_OUTPATIENT_CLINIC_OR_DEPARTMENT_OTHER): Payer: Self-pay | Admitting: *Deleted

## 2014-03-07 DIAGNOSIS — Z8669 Personal history of other diseases of the nervous system and sense organs: Secondary | ICD-10-CM | POA: Diagnosis not present

## 2014-03-07 DIAGNOSIS — N201 Calculus of ureter: Secondary | ICD-10-CM | POA: Insufficient documentation

## 2014-03-07 DIAGNOSIS — Z8639 Personal history of other endocrine, nutritional and metabolic disease: Secondary | ICD-10-CM | POA: Insufficient documentation

## 2014-03-07 DIAGNOSIS — Z87442 Personal history of urinary calculi: Secondary | ICD-10-CM | POA: Insufficient documentation

## 2014-03-07 DIAGNOSIS — Z880 Allergy status to penicillin: Secondary | ICD-10-CM | POA: Diagnosis not present

## 2014-03-07 DIAGNOSIS — R109 Unspecified abdominal pain: Secondary | ICD-10-CM | POA: Diagnosis present

## 2014-03-07 LAB — BASIC METABOLIC PANEL
Anion gap: 10 (ref 5–15)
BUN: 12 mg/dL (ref 6–23)
CO2: 25 mmol/L (ref 19–32)
Calcium: 9.3 mg/dL (ref 8.4–10.5)
Chloride: 103 mEq/L (ref 96–112)
Creatinine, Ser: 0.87 mg/dL (ref 0.50–1.35)
GFR calc non Af Amer: >90 mL/min (ref >90)
Glucose, Bld: 118 mg/dL — ABNORMAL HIGH (ref 70–99)
Potassium: 3.9 mmol/L (ref 3.5–5.1)
Sodium: 138 mmol/L (ref 135–145)

## 2014-03-07 LAB — URINALYSIS, ROUTINE W REFLEX MICROSCOPIC
Bilirubin Urine: NEGATIVE
Glucose, UA: NEGATIVE mg/dL
Ketones, ur: NEGATIVE mg/dL
Leukocytes, UA: NEGATIVE
NITRITE: NEGATIVE
Protein, ur: NEGATIVE mg/dL
SPECIFIC GRAVITY, URINE: 1.014 (ref 1.005–1.030)
Urobilinogen, UA: 0.2 mg/dL (ref 0.0–1.0)
pH: 7.5 (ref 5.0–8.0)

## 2014-03-07 LAB — CBC WITH DIFFERENTIAL/PLATELET
Basophils Absolute: 0 10*3/uL (ref 0.0–0.1)
Basophils Relative: 0 % (ref 0–1)
Eosinophils Absolute: 0.2 10*3/uL (ref 0.0–0.7)
Eosinophils Relative: 2 % (ref 0–5)
HCT: 48.9 % (ref 39.0–52.0)
Hemoglobin: 17.5 g/dL — ABNORMAL HIGH (ref 13.0–17.0)
Lymphocytes Relative: 33 % (ref 12–46)
Lymphs Abs: 2.9 10*3/uL (ref 0.7–4.0)
MCH: 29.5 pg (ref 26.0–34.0)
MCHC: 35.8 g/dL (ref 30.0–36.0)
MCV: 82.5 fL (ref 78.0–100.0)
MONO ABS: 0.6 10*3/uL (ref 0.1–1.0)
Monocytes Relative: 7 % (ref 3–12)
Neutro Abs: 4.9 10*3/uL (ref 1.7–7.7)
Neutrophils Relative %: 58 % (ref 43–77)
PLATELETS: 230 10*3/uL (ref 150–400)
RBC: 5.93 MIL/uL — ABNORMAL HIGH (ref 4.22–5.81)
RDW: 12.8 % (ref 11.5–15.5)
WBC: 8.6 10*3/uL (ref 4.0–10.5)

## 2014-03-07 LAB — URINE MICROSCOPIC-ADD ON

## 2014-03-07 MED ORDER — KETOROLAC TROMETHAMINE 30 MG/ML IJ SOLN
30.0000 mg | Freq: Once | INTRAMUSCULAR | Status: AC
  Administered 2014-03-07: 30 mg via INTRAVENOUS
  Filled 2014-03-07: qty 1

## 2014-03-07 MED ORDER — ONDANSETRON HCL 4 MG/2ML IJ SOLN
4.0000 mg | Freq: Once | INTRAMUSCULAR | Status: AC
  Administered 2014-03-07: 4 mg via INTRAVENOUS
  Filled 2014-03-07: qty 2

## 2014-03-07 MED ORDER — OXYCODONE-ACETAMINOPHEN 5-325 MG PO TABS: 2.0000 | ORAL_TABLET | ORAL | Status: DC | PRN

## 2014-03-07 MED ORDER — HYDROMORPHONE HCL 1 MG/ML IJ SOLN
1.0000 mg | Freq: Once | INTRAMUSCULAR | Status: AC
  Administered 2014-03-07: 1 mg via INTRAVENOUS
  Filled 2014-03-07: qty 1

## 2014-03-07 MED ORDER — ONDANSETRON 4 MG PO TBDP: 4.0000 mg | ORAL_TABLET | Freq: Three times a day (TID) | ORAL | Status: DC | PRN

## 2014-03-07 MED ORDER — SODIUM CHLORIDE 0.9 % IV BOLUS (SEPSIS)
1000.0000 mL | Freq: Once | INTRAVENOUS | Status: AC
  Administered 2014-03-07: 1000 mL via INTRAVENOUS

## 2014-03-07 NOTE — ED Notes (Signed)
Pt reports left flank pain radiating to left abdomen since 1000 with nausea- denies chest pain- reports numbness to face and right arm- pt hyperventilating- pt encouraged to slow breathing

## 2014-03-07 NOTE — Discharge Instructions (Signed)

## 2014-03-07 NOTE — ED Provider Notes (Signed)
CSN: [id number]     Arrival date & time 03/07/14  1343 History   First MD Initiated Contact with Patient 03/07/14 1359     Chief Complaint  Patient presents with  . Flank Pain     (Consider location/radiation/quality/duration/timing/severity/associated sxs/prior Treatment) HPI Comments: Acute onset of left flank pain and abdominal pain that started this morning. No dysuria, vomiting, or fever. Pt states that he is having nausea. History of stones, but states this feels worse then normal  Patient is a 44 y.o. male presenting with flank pain. The history is provided by the patient. No language interpreter was used.  Flank Pain This is a new problem. The current episode started today. The problem occurs constantly. The problem has been unchanged. Pertinent negatives include no fever. Nothing aggravates the symptoms. He has tried nothing for the symptoms.    Past Medical History  Diagnosis Date  . Hyperlipidemia   . Kidney stones   . Sleep apnea   . HYPERLIPIDEMIA 04/01/2009  . Sleep apnea   . Kidney stones    Past Surgical History  Procedure Laterality Date  . Tonsillectomy    . Tonsillectomy     Family History  Problem Relation Age of Onset  . Hyperlipidemia Mother   . Alcohol abuse Father   . Hyperlipidemia Father   . Hypertension Father   . Heart disease Father 22    CAD  . Diabetes Father     grandfather  . Alcohol abuse Paternal Uncle   . Alcohol abuse Paternal Grandfather   . Cancer      prostate/grandfather, colon/grandmother  . Alzheimer's disease      grandmother  . Hypothyroidism Sister   . Sudden death      grandfather  . Stroke      grandfather   History  Substance Use Topics  . Smoking status: Never Smoker   . Smokeless tobacco: Never Used  . Alcohol Use: No    Review of Systems  Constitutional: Negative for fever.  Genitourinary: Positive for flank pain.  All other systems reviewed and are negative.     Allergies  Penicillins  Home  Medications   Prior to Admission medications   Not on File   BP 156/97 mmHg  Pulse 78  Temp(Src) 97.6 F (36.4 C) (Oral)  Resp 24  Ht 5\' 11"  (1.803 m)  Wt 255 lb (115.667 kg)  BMI 35.58 kg/m2  SpO2 99% Physical Exam  Constitutional: He is oriented to person, place, and time. He appears well-developed and well-nourished. He appears distressed.  Cardiovascular: Normal rate and regular rhythm.   Pulmonary/Chest: Effort normal and breath sounds normal.  Abdominal: Soft. Bowel sounds are normal. There is no tenderness. There is no CVA tenderness.  Musculoskeletal: Normal range of motion.  Neurological: He is alert and oriented to person, place, and time. Coordination normal.  Skin: Skin is warm and dry.  Psychiatric: He has a normal mood and affect.  Nursing note and vitals reviewed.   ED Course  Procedures (including critical care time) Labs Review Labs Reviewed  URINALYSIS, ROUTINE W REFLEX MICROSCOPIC - Abnormal; Notable for the following:    Hgb urine dipstick MODERATE (*)    All other components within normal limits  BASIC METABOLIC PANEL - Abnormal; Notable for the following:    Glucose, Bld 118 (*)    All other components within normal limits  CBC WITH DIFFERENTIAL - Abnormal; Notable for the following:    RBC 5.93 (*)    Hemoglobin  17.5 (*)    All other components within normal limits  URINE MICROSCOPIC-ADD ON    Imaging Review Ct Renal Stone Study  03/07/2014   CLINICAL DATA:  Left flank pain radiating into the left groin which began earlier today. Prior history of urinary tract calculi.  EXAM: CT ABDOMEN AND PELVIS WITHOUT CONTRAST  TECHNIQUE: Multidetector CT imaging of the abdomen and pelvis was performed following the standard protocol without IV contrast.  COMPARISON:  None.  FINDINGS: 5 mm calculus which has either recently been passed or is within the intramural portion of the left ureter at the UVJ accounting for moderate left hydroureteronephrosis. Mild left  renal edema and perinephric edema. Non obstructing approximate 3 mm calculus in a mid posterior mid calix of the right kidney. Tiny (1-2 mm) nonobstructing calculus in an upper pole calix of the left kidney. Within the limits of the unenhanced technique, no focal parenchymal abnormality involving either kidney.  Normal unenhanced appearance of the liver, spleen, pancreas, adrenal glands, and gallbladder. No biliary ductal dilation. Mild to moderate atherosclerosis involving the distal abdominal aorta in the common and internal iliac arteries bilaterally without aneurysm. No significant lymphadenopathy.  Normal-appearing stomach and small bowel. Entire colon relatively decompressed and unremarkable. Normal-appearing decompressed appendix in the right upper pelvis. No ascites. Small umbilical hernia containing fat.  Mild median lobe prostate gland enlargement. Phleboliths low in the left side of the pelvis. Small right inguinal hernia containing fat.  Bone window images demonstrate mild lower thoracic spondylosis, moderate to severe degenerative disc disease and spondylosis at L5-S1. Pleuroparenchymal scarring and pleural calcification involving the lingula. Visualized lung bases otherwise clear. Subpleural lipoma posterolaterally in the right lower hemithorax.  IMPRESSION: 1. 5 mm calculus which has either recently been passed or is within the intramural portion of the distal left ureter at the UVJ accounting for moderate left hydroureteronephrosis. 2. Non obstructing bilateral renal calculi. 3. Mild-to-moderate distal abdominal aortic and bilateral iliac atherosclerosis which is somewhat advanced for age. 4. Right inguinal hernia containing fat. Small umbilical hernia containing fat.   Electronically Signed   By: Evangeline Dakin M.D.   On: 03/07/2014 14:45     EKG Interpretation None      MDM   Final diagnoses:  Ureteral stone    Pt has either passed or is close to passing a stone. Urine is not  infected. Pt is comfortable after the toradol. Will treat with oxycodone and zofran   Glendell Docker, NP 03/07/14 [place], MD 03/07/14 [phone]

## 2014-03-10 ENCOUNTER — Telehealth: Payer: Self-pay | Admitting: Family Medicine

## 2014-03-10 ENCOUNTER — Other Ambulatory Visit: Payer: Self-pay | Admitting: Family Medicine

## 2014-03-10 DIAGNOSIS — N200 Calculus of kidney: Secondary | ICD-10-CM

## 2014-03-10 NOTE — Telephone Encounter (Signed)
Referral is ordered

## 2014-03-10 NOTE — Telephone Encounter (Signed)
Patient states he was seen in the ED on 03/07/14 for a kidney stone, which he passed it later that evening. Patient was able to catch the stone and was told by the ED to keep it and have a Urologist exam it.  Does the patient need a 30 min Office visit with Dr. Elease Hashimoto or can he have a referral to Urology placed? Please advise.   He said that he isn't having any more pain since the stone was passed and that this is his 3rd occurrence of passing a kidney stone.

## 2014-03-10 NOTE — Telephone Encounter (Signed)
OK to set up urology referral.

## 2014-12-18 ENCOUNTER — Other Ambulatory Visit (INDEPENDENT_AMBULATORY_CARE_PROVIDER_SITE_OTHER): Payer: BC Managed Care – PPO

## 2014-12-18 DIAGNOSIS — Z0000 Encounter for general adult medical examination without abnormal findings: Secondary | ICD-10-CM

## 2014-12-18 DIAGNOSIS — R7989 Other specified abnormal findings of blood chemistry: Secondary | ICD-10-CM | POA: Diagnosis not present

## 2014-12-18 LAB — BASIC METABOLIC PANEL
BUN: 15 mg/dL (ref 6–23)
CO2: 30 meq/L (ref 19–32)
Calcium: 9.4 mg/dL (ref 8.4–10.5)
Chloride: 102 mEq/L (ref 96–112)
Creatinine, Ser: 0.92 mg/dL (ref 0.40–1.50)
GFR: 94.59 mL/min (ref >60.00)
GLUCOSE: 94 mg/dL (ref 70–99)
POTASSIUM: 4.6 meq/L (ref 3.5–5.1)
SODIUM: 140 meq/L (ref 135–145)

## 2014-12-18 LAB — CBC WITH DIFFERENTIAL/PLATELET
Basophils Absolute: 0 10*3/uL (ref 0.0–0.1)
Basophils Relative: 0.7 % (ref 0.0–3.0)
EOS PCT: 2.9 % (ref 0.0–5.0)
Eosinophils Absolute: 0.2 10*3/uL (ref 0.0–0.7)
HCT: 49.1 % (ref 39.0–52.0)
Hemoglobin: 17 g/dL (ref 13.0–17.0)
LYMPHS ABS: 2.4 10*3/uL (ref 0.7–4.0)
Lymphocytes Relative: 40.7 % (ref 12.0–46.0)
MCHC: 34.6 g/dL (ref 30.0–36.0)
MCV: 85.4 fl (ref 78.0–100.0)
Monocytes Absolute: 0.5 10*3/uL (ref 0.1–1.0)
Monocytes Relative: 8.9 % (ref 3.0–12.0)
NEUTROS PCT: 46.8 % (ref 43.0–77.0)
Neutro Abs: 2.7 10*3/uL (ref 1.4–7.7)
Platelets: 247 10*3/uL (ref 150.0–400.0)
RBC: 5.75 Mil/uL (ref 4.22–5.81)
RDW: 12.9 % (ref 11.5–15.5)
WBC: 5.8 10*3/uL (ref 4.0–10.5)

## 2014-12-18 LAB — HEPATIC FUNCTION PANEL
ALBUMIN: 4 g/dL (ref 3.5–5.2)
ALT: 29 U/L (ref 0–53)
AST: 21 U/L (ref 0–37)
Alkaline Phosphatase: 47 U/L (ref 39–117)
Bilirubin, Direct: 0.1 mg/dL (ref 0.0–0.3)
Total Bilirubin: 0.5 mg/dL (ref 0.2–1.2)
Total Protein: 7.5 g/dL (ref 6.0–8.3)

## 2014-12-18 LAB — LIPID PANEL
CHOL/HDL RATIO: 7
Cholesterol: 212 mg/dL — ABNORMAL HIGH (ref 0–200)
HDL: 31 mg/dL — ABNORMAL LOW (ref >39.00)
NonHDL: 181.15
Triglycerides: 269 mg/dL — ABNORMAL HIGH (ref 0.0–149.0)
VLDL: 53.8 mg/dL — ABNORMAL HIGH (ref 0.0–40.0)

## 2014-12-18 LAB — TSH: TSH: 0.98 u[IU]/mL (ref 0.35–4.50)

## 2014-12-18 LAB — LDL CHOLESTEROL, DIRECT: LDL DIRECT: 116 mg/dL

## 2014-12-23 ENCOUNTER — Ambulatory Visit (INDEPENDENT_AMBULATORY_CARE_PROVIDER_SITE_OTHER): Payer: BC Managed Care – PPO | Admitting: Family Medicine

## 2014-12-23 ENCOUNTER — Encounter: Payer: Self-pay | Admitting: Family Medicine

## 2014-12-23 VITALS — BP 110/70 | HR 87 | Temp 98.4°F | Ht 70.87 in | Wt 269.1 lb

## 2014-12-23 DIAGNOSIS — Z0000 Encounter for general adult medical examination without abnormal findings: Secondary | ICD-10-CM

## 2014-12-23 DIAGNOSIS — R300 Dysuria: Secondary | ICD-10-CM

## 2014-12-23 DIAGNOSIS — Z23 Encounter for immunization: Secondary | ICD-10-CM

## 2014-12-23 LAB — POCT URINALYSIS DIPSTICK
BILIRUBIN UA: NEGATIVE
Glucose, UA: NEGATIVE
KETONES UA: NEGATIVE
Leukocytes, UA: NEGATIVE
Nitrite, UA: NEGATIVE
PROTEIN UA: NEGATIVE
SPEC GRAV UA: 1.02
Urobilinogen, UA: 0.2
pH, UA: 7

## 2014-12-23 NOTE — Progress Notes (Signed)
Pre visit review using our clinic review tool, if applicable. No additional management support is needed unless otherwise documented below in the visit note. 

## 2014-12-23 NOTE — Progress Notes (Signed)
Subjective:    Patient ID: Scott Mcconnell, male    DOB: 24-Nov-1970, 44 y.o.   MRN: [id number]  HPI Here for complete physical. He has history of obesity and metabolic syndrome. Unfortunately, he has gained about 14 pounds since last year. He did start exercise program about a month and half ago is currently exercising 4 days per week. He has history of kidney stones and high triglycerides Currently not taking any medications.  Past Medical History  Diagnosis Date  . Hyperlipidemia   . Kidney stones   . Sleep apnea   . HYPERLIPIDEMIA 04/01/2009  . Sleep apnea   . Kidney stones    Past Surgical History  Procedure Laterality Date  . Tonsillectomy    . Tonsillectomy      reports that he has never smoked. He has never used smokeless tobacco. He reports that he does not drink alcohol or use illicit drugs. family history includes Alcohol abuse in his father, paternal grandfather, and paternal uncle; Alzheimer's disease in an other family member; Cancer in an other family member; Diabetes in his father; Heart disease (age of onset: 36) in his father; Hyperlipidemia in his father and mother; Hypertension in his father; Hypothyroidism in his sister; Stroke in an other family member; Sudden death in an other family member. Allergies  Allergen Reactions  . Penicillins     As a child      Review of Systems  Constitutional: Negative for fever, activity change, appetite change and fatigue.  HENT: Negative for congestion, ear pain and trouble swallowing.   Eyes: Negative for pain and visual disturbance.  Respiratory: Negative for cough, shortness of breath and wheezing.   Cardiovascular: Negative for chest pain and palpitations.  Gastrointestinal: Negative for nausea, vomiting, abdominal pain, diarrhea, constipation, blood in stool, abdominal distention and rectal pain.  Genitourinary: Negative for dysuria, hematuria and testicular pain.  Musculoskeletal: Negative for joint swelling and  arthralgias.  Skin: Negative for rash.  Neurological: Negative for dizziness, syncope and headaches.  Hematological: Negative for adenopathy.  Psychiatric/Behavioral: Negative for confusion and dysphoric mood.       Objective:   Physical Exam  Constitutional: He is oriented to person, place, and time. He appears well-developed and well-nourished. No distress.  HENT:  Head: Normocephalic and atraumatic.  Right Ear: External ear normal.  Left Ear: External ear normal.  Mouth/Throat: Oropharynx is clear and moist.  Eyes: Conjunctivae and EOM are normal. Pupils are equal, round, and reactive to light.  Neck: Normal range of motion. Neck supple. No thyromegaly present.  Cardiovascular: Normal rate, regular rhythm and normal heart sounds.   No murmur heard. Pulmonary/Chest: No respiratory distress. He has no wheezes. He has no rales.  Abdominal: Soft. Bowel sounds are normal. He exhibits no distension. There is no tenderness. There is no rebound and no guarding.  He has small ventral and umbilical hernias which are nontender to palpation  Musculoskeletal: He exhibits no edema.  Lymphadenopathy:    He has no cervical adenopathy.  Neurological: He is alert and oriented to person, place, and time. He displays normal reflexes. No cranial nerve deficit.  Skin: No rash noted.  Psychiatric: He has a normal mood and affect.          Assessment & Plan:  Complete physical. Labs reviewed. His blood sugars are   normal. He has dyslipidemia and high triglycerides and low HDL. We discussed at some length importance of weight loss and continued diligence with exercise and reducing sugars  and starches.  Dysuria for the past 2-3 days. Check urine dipstick He has 2+ blood o/w normal. ?related to recurrent stone.

## 2014-12-23 NOTE — Patient Instructions (Signed)
Food Choices to Lower Your Triglycerides Triglycerides are a type of fat in your blood. High levels of triglycerides can increase the risk of heart disease and stroke. If your triglyceride levels are high, the foods you eat and your eating habits are very important. Choosing the right foods can help lower your triglycerides.  WHAT GENERAL GUIDELINES DO I NEED TO FOLLOW?  Lose weight if you are overweight.   Limit or avoid alcohol.   Fill one half of your plate with vegetables and green salads.   Limit fruit to two servings a day. Choose fruit instead of juice.   Make one fourth of your plate whole grains. Look for the word "whole" as the first word in the ingredient list.  Fill one fourth of your plate with lean protein foods.  Enjoy fatty fish (such as salmon, mackerel, sardines, and tuna) three times a week.   Choose healthy fats.   Limit foods high in starch and sugar.  Eat more home-cooked food and less restaurant, buffet, and fast food.  Limit fried foods.  Cook foods using methods other than frying.  Limit saturated fats.  Check ingredient lists to avoid foods with partially hydrogenated oils (trans fats) in them. WHAT FOODS CAN I EAT?  Grains Whole grains, such as whole wheat or whole grain breads, crackers, cereals, and pasta. Unsweetened oatmeal, bulgur, barley, quinoa, or brown rice. Corn or whole wheat flour tortillas.  Vegetables Fresh or frozen vegetables (raw, steamed, roasted, or grilled). Green salads. Fruits All fresh, canned (in natural juice), or frozen fruits. Meat and Other Protein Products Ground beef (85% or leaner), grass-fed beef, or beef trimmed of fat. Skinless chicken or turkey. Ground chicken or turkey. Pork trimmed of fat. All fish and seafood. Eggs. Dried beans, peas, or lentils. Unsalted nuts or seeds. Unsalted canned or dry beans. Dairy Low-fat dairy products, such as skim or 1% milk, 2% or reduced-fat cheeses, low-fat ricotta or cottage  cheese, or plain low-fat yogurt. Fats and Oils Tub margarines without trans fats. Light or reduced-fat mayonnaise and salad dressings. Avocado. Safflower, olive, or canola oils. Natural peanut or almond butter. The items listed above may not be a complete list of recommended foods or beverages. Contact your dietitian for more options. WHAT FOODS ARE NOT RECOMMENDED?  Grains White bread. White pasta. White rice. Cornbread. Bagels, pastries, and croissants. Crackers that contain trans fat. Vegetables White potatoes. Corn. Creamed or fried vegetables. Vegetables in a cheese sauce. Fruits Dried fruits. Canned fruit in light or heavy syrup. Fruit juice. Meat and Other Protein Products Fatty cuts of meat. Ribs, chicken wings, bacon, sausage, bologna, salami, chitterlings, fatback, hot dogs, bratwurst, and packaged luncheon meats. Dairy Whole or 2% milk, cream, half-and-half, and cream cheese. Whole-fat or sweetened yogurt. Full-fat cheeses. Nondairy creamers and whipped toppings. Processed cheese, cheese spreads, or cheese curds. Sweets and Desserts Corn syrup, sugars, honey, and molasses. Candy. Jam and jelly. Syrup. Sweetened cereals. Cookies, pies, cakes, donuts, muffins, and ice cream. Fats and Oils Butter, stick margarine, lard, shortening, ghee, or bacon fat. Coconut, palm kernel, or palm oils. Beverages Alcohol. Sweetened drinks (such as sodas, lemonade, and fruit drinks or punches). The items listed above may not be a complete list of foods and beverages to avoid. Contact your dietitian for more information.   This information is not intended to replace advice given to you by your health care provider. Make sure you discuss any questions you have with your health care provider.   Document Released: 12/10/2003   Document Revised: 03/14/2014 Document Reviewed: 12/26/2012 Elsevier Interactive Patient Education 2016 [place] Guidelines to Help Prevent Kidney Stones Your risk  of kidney stones can be decreased by adjusting the foods you eat. The most important thing you can do is drink enough fluid. You should drink enough fluid to keep your urine clear or pale yellow. The following guidelines provide specific information for the type of kidney stone you have had. GUIDELINES ACCORDING TO TYPE OF KIDNEY STONE Calcium Oxalate Kidney Stones  Reduce the amount of salt you eat. Foods that have a lot of salt cause your body to release excess calcium into your urine. The excess calcium can combine with a substance called oxalate to form kidney stones.  Reduce the amount of animal protein you eat if the amount you eat is excessive. Animal protein causes your body to release excess calcium into your urine. Ask your dietitian how much protein from animal sources you should be eating.  Avoid foods that are high in oxalates. If you take vitamins, they should have less than 500 mg of vitamin C. Your body turns vitamin C into oxalates. You do not need to avoid fruits and vegetables high in vitamin C. Calcium Phosphate Kidney Stones  Reduce the amount of salt you eat to help prevent the release of excess calcium into your urine.  Reduce the amount of animal protein you eat if the amount you eat is excessive. Animal protein causes your body to release excess calcium into your urine. Ask your dietitian how much protein from animal sources you should be eating.  Get enough calcium from food or take a calcium supplement (ask your dietitian for recommendations). Food sources of calcium that do not increase your risk of kidney stones include:  Broccoli.  Dairy products, such as cheese and yogurt.  Pudding. Uric Acid Kidney Stones  Do not have more than 6 oz of animal protein per day. FOOD SOURCES Animal Protein Sources  Meat (all types).  Poultry.  Eggs.  Fish, seafood. Foods High in [employer] seasonings.  Soy sauce.  Teriyaki sauce.  Cured and processed  meats.  Salted crackers and snack foods.  Fast food.  Canned soups and most canned foods. Foods High in Oxalates  Grains:  Amaranth.  Barley.  Grits.  Wheat germ.  Bran.  Buckwheat flour.  All bran cereals.  Pretzels.  Whole wheat bread.  Vegetables:  Beans (wax).  Beets and beet greens.  Collard greens.  Eggplant.  [place].  Leeks.  Okra.  Parsley.  [place]agas.  Spinach.  Swiss chard.  Tomato paste.  Fried potatoes.  Sweet potatoes.  Fruits:  Red currants.  Figs.  Kiwi.  Rhubarb.  Meat and Other Protein Sources:  Beans (dried).  Soy burgers and other soybean products.  Miso.  Nuts (peanuts, almonds, pecans, cashews, hazelnuts).  Nut butters.  Sesame seeds and tahini (paste made of sesame seeds).  Poppy seeds.  Beverages:  Chocolate drink mixes.  Soy milk.  Instant iced tea.  Juices made from high-oxalate fruits or vegetables.  Other:  Carob.  Chocolate.  Fruitcake.  Marmalades.   This information is not intended to replace advice given to you by your health care provider. Make sure you discuss any questions you have with your health care provider.   Document Released: 06/18/2010 Document Revised: 02/26/2013 Document Reviewed: 01/18/2013 Elsevier Interactive Patient Education [employer].

## 2015-11-20 ENCOUNTER — Other Ambulatory Visit (INDEPENDENT_AMBULATORY_CARE_PROVIDER_SITE_OTHER): Payer: BLUE CROSS/BLUE SHIELD

## 2015-11-20 DIAGNOSIS — R7989 Other specified abnormal findings of blood chemistry: Secondary | ICD-10-CM

## 2015-11-20 DIAGNOSIS — Z0000 Encounter for general adult medical examination without abnormal findings: Secondary | ICD-10-CM

## 2015-11-20 LAB — HEPATIC FUNCTION PANEL
ALK PHOS: 45 U/L (ref 39–117)
ALT: 29 U/L (ref 0–53)
AST: 18 U/L (ref 0–37)
Albumin: 4.1 g/dL (ref 3.5–5.2)
BILIRUBIN DIRECT: 0.1 mg/dL (ref 0.0–0.3)
BILIRUBIN TOTAL: 0.7 mg/dL (ref 0.2–1.2)
Total Protein: 7.5 g/dL (ref 6.0–8.3)

## 2015-11-20 LAB — CBC WITH DIFFERENTIAL/PLATELET
Basophils Absolute: 0 10*3/uL (ref 0.0–0.1)
Basophils Relative: 0.7 % (ref 0.0–3.0)
EOS ABS: 0.1 10*3/uL (ref 0.0–0.7)
Eosinophils Relative: 2.5 % (ref 0.0–5.0)
HCT: 48.4 % (ref 39.0–52.0)
HEMOGLOBIN: 16.8 g/dL (ref 13.0–17.0)
Lymphocytes Relative: 39.9 % (ref 12.0–46.0)
Lymphs Abs: 2.3 10*3/uL (ref 0.7–4.0)
MCHC: 34.8 g/dL (ref 30.0–36.0)
MCV: 85.1 fl (ref 78.0–100.0)
MONO ABS: 0.4 10*3/uL (ref 0.1–1.0)
Monocytes Relative: 7.6 % (ref 3.0–12.0)
Neutro Abs: 2.9 10*3/uL (ref 1.4–7.7)
Neutrophils Relative %: 49.3 % (ref 43.0–77.0)
Platelets: 259 10*3/uL (ref 150.0–400.0)
RBC: 5.68 Mil/uL (ref 4.22–5.81)
RDW: 12.4 % (ref 11.5–15.5)
WBC: 5.9 10*3/uL (ref 4.0–10.5)

## 2015-11-20 LAB — LIPID PANEL
CHOL/HDL RATIO: 6
Cholesterol: 204 mg/dL — ABNORMAL HIGH (ref 0–200)
HDL: 33.7 mg/dL — ABNORMAL LOW (ref >39.00)
NONHDL: 170.11
Triglycerides: 204 mg/dL — ABNORMAL HIGH (ref 0.0–149.0)
VLDL: 40.8 mg/dL — ABNORMAL HIGH (ref 0.0–40.0)

## 2015-11-20 LAB — BASIC METABOLIC PANEL
BUN: 16 mg/dL (ref 6–23)
CO2: 30 mEq/L (ref 19–32)
CREATININE: 0.93 mg/dL (ref 0.40–1.50)
Calcium: 9.1 mg/dL (ref 8.4–10.5)
Chloride: 101 mEq/L (ref 96–112)
GFR: 93.04 mL/min (ref >60.00)
Glucose, Bld: 92 mg/dL (ref 70–99)
POTASSIUM: 4 meq/L (ref 3.5–5.1)
Sodium: 141 mEq/L (ref 135–145)

## 2015-11-20 LAB — TSH: TSH: 2.35 u[IU]/mL (ref 0.35–4.50)

## 2015-11-20 LAB — LDL CHOLESTEROL, DIRECT: LDL DIRECT: 117 mg/dL

## 2015-11-25 ENCOUNTER — Encounter: Payer: Self-pay | Admitting: Family Medicine

## 2015-11-25 ENCOUNTER — Ambulatory Visit (INDEPENDENT_AMBULATORY_CARE_PROVIDER_SITE_OTHER): Payer: BLUE CROSS/BLUE SHIELD | Admitting: Family Medicine

## 2015-11-25 VITALS — BP 100/80 | HR 89 | Temp 98.3°F | Ht 70.0 in | Wt 267.0 lb

## 2015-11-25 DIAGNOSIS — Z23 Encounter for immunization: Secondary | ICD-10-CM | POA: Diagnosis not present

## 2015-11-25 DIAGNOSIS — Z0000 Encounter for general adult medical examination without abnormal findings: Secondary | ICD-10-CM | POA: Diagnosis not present

## 2015-11-25 DIAGNOSIS — K429 Umbilical hernia without obstruction or gangrene: Secondary | ICD-10-CM

## 2015-11-25 NOTE — Progress Notes (Signed)
Subjective:     Patient ID: Scott Mcconnell, male   DOB: [birth year], 45 y.o.   MRN: [id number]  HPI Patient seen for physical exam. He has history of metabolic syndrome. Is lost 2 pounds since last year. Takes no medications. No history of diabetes. Exercises about 4 days per week. Has not made much dietary change. Tetanus up-to-date. No chest pains. No dyspnea.  History of umbilical hernia. Sometimes mildly symptomatic with lifting or exercise.  Past Medical History:  Diagnosis Date  . Hyperlipidemia   . HYPERLIPIDEMIA 04/01/2009  . Kidney stones   . Kidney stones   . Sleep apnea   . Sleep apnea    Past Surgical History:  Procedure Laterality Date  . TONSILLECTOMY    . TONSILLECTOMY      reports that he has never smoked. He has never used smokeless tobacco. He reports that he does not drink alcohol or use drugs. family history includes Alcohol abuse in his father, paternal grandfather, and paternal uncle; Cancer (age of onset: 27) in his mother; Diabetes in his father; Heart disease (age of onset: 11) in his father; Hyperlipidemia in his father and mother; Hypertension in his father; Hypothyroidism in his sister. Allergies  Allergen Reactions  . Penicillins     As a child     Review of Systems  Constitutional: Negative for activity change, appetite change, fatigue and fever.  HENT: Negative for congestion, ear pain and trouble swallowing.   Eyes: Negative for pain and visual disturbance.  Respiratory: Negative for cough, shortness of breath and wheezing.   Cardiovascular: Negative for chest pain and palpitations.  Gastrointestinal: Negative for abdominal distention, blood in stool, constipation, diarrhea, nausea, rectal pain and vomiting.  Genitourinary: Negative for dysuria, hematuria and testicular pain.  Musculoskeletal: Negative for arthralgias and joint swelling.  Skin: Negative for rash.  Neurological: Negative for dizziness, syncope and headaches.  Hematological:  Negative for adenopathy.  Psychiatric/Behavioral: Negative for confusion and dysphoric mood.       Objective:   Physical Exam  Constitutional: He is oriented to person, place, and time. He appears well-developed and well-nourished. No distress.  HENT:  Head: Normocephalic and atraumatic.  Right Ear: External ear normal.  Left Ear: External ear normal.  Mouth/Throat: Oropharynx is clear and moist.  Eyes: Conjunctivae and EOM are normal. Pupils are equal, round, and reactive to light.  Neck: Normal range of motion. Neck supple. No thyromegaly present.  Cardiovascular: Normal rate, regular rhythm and normal heart sounds.   No murmur heard. Pulmonary/Chest: No respiratory distress. He has no wheezes. He has no rales.  Abdominal: Soft. Bowel sounds are normal. He exhibits no distension. There is no tenderness. There is no rebound and no guarding.  Patient has small umbilical hernia which is soft. Probable ventral hernia as well  Musculoskeletal: He exhibits no edema.  Lymphadenopathy:    He has no cervical adenopathy.  Neurological: He is alert and oriented to person, place, and time. He displays normal reflexes. No cranial nerve deficit.  Skin: No rash noted.  has a couple benign dermatofibromas on the lower legs  Psychiatric: He has a normal mood and affect.       Assessment:     Physical exam. He has history of obesity and metabolic syndrome. Patient also has umbilical hernia which is mildly symptomatic with activity    Plan:     -Strongly advised to lose weight and dietary strategies discussed -Labs reviewed with no major concerns or changes -Referral to general  surgeon regarding umbilical hernia -Flu vaccine given  Eulas Post MD [facility] at [facility]

## 2015-11-25 NOTE — Patient Instructions (Signed)
Hernia, Adult A hernia is the bulging of an organ or tissue through a weak spot in the muscles of the abdomen (abdominal wall). Hernias develop most often near the navel or groin. There are many kinds of hernias. Common kinds include:  Femoral hernia. This kind of hernia develops under the groin in the upper thigh area.  Inguinal hernia. This kind of hernia develops in the groin or scrotum.  Umbilical hernia. This kind of hernia develops near the navel.  Hiatal hernia. This kind of hernia causes part of the stomach to be pushed up into the chest.  Incisional hernia. This kind of hernia bulges through a scar from an abdominal surgery. CAUSES This condition may be caused by:  Heavy lifting.  Coughing over a long period of time.  Straining to have a bowel movement.  An incision made during an abdominal surgery.  A birth defect (congenital defect).  Excess weight or obesity.  Smoking.  Poor nutrition.  Cystic fibrosis.  Excess fluid in the abdomen.  Undescended testicles. SYMPTOMS Symptoms of a hernia include:  A lump on the abdomen. This is the first sign of a hernia. The lump may become more obvious with standing, straining, or coughing. It may get bigger over time if it is not treated or if the condition causing it is not treated.  Pain. A hernia is usually painless, but it may become painful over time if treatment is delayed. The pain is usually dull and may get worse with standing or lifting heavy objects. Sometimes a hernia gets tightly squeezed in the weak spot (strangulated) or stuck there (incarcerated) and causes additional symptoms. These symptoms may include:  Vomiting.  Nausea.  Constipation.  Irritability. DIAGNOSIS A hernia may be diagnosed with:  A physical exam. During the exam your health care provider may ask you to cough or to make a specific movement, because a hernia is usually more visible when you move.  Imaging tests. These can  include:  X-rays.  Ultrasound.  CT scan. TREATMENT A hernia that is small and painless may not need to be treated. A hernia that is large or painful may be treated with surgery. Inguinal hernias may be treated with surgery to prevent incarceration or strangulation. Strangulated hernias are always treated with surgery, because lack of blood to the trapped organ or tissue can cause it to die. Surgery to treat a hernia involves pushing the bulge back into place and repairing the weak part of the abdomen. HOME CARE INSTRUCTIONS  Avoid straining.  Do not lift anything heavier than 10 lb (4.5 kg).  Lift with your leg muscles, not your back muscles. This helps avoid strain.  When coughing, try to cough gently.  Prevent constipation. Constipation leads to straining with bowel movements, which can make a hernia worse or cause a hernia repair to break down. You can prevent constipation by:  Eating a high-fiber diet that includes plenty of fruits and vegetables.  Drinking enough fluids to keep your urine clear or pale yellow. Aim to drink 6-8 glasses of water per day.  Using a stool softener as directed by your health care provider.  Lose weight, if you are overweight.  Do not use any tobacco products, including cigarettes, chewing tobacco, or electronic cigarettes. If you need help quitting, ask your health care provider.  Keep all follow-up visits as directed by your health care provider. This is important. Your health care provider may need to monitor your condition. SEEK MEDICAL CARE IF:  You have   swelling, redness, and pain in the affected area.  Your bowel habits change. SEEK IMMEDIATE MEDICAL CARE IF:  You have a fever.  You have abdominal pain that is getting worse.  You feel nauseous or you vomit.  You cannot push the hernia back in place by gently pressing on it while you are lying down.  The hernia:  Changes in shape or size.  Is stuck outside the  abdomen.  Becomes discolored.  Feels hard or tender.   This information is not intended to replace advice given to you by your health care provider. Make sure you discuss any questions you have with your health care provider.   Document Released: 02/21/2005 Document Revised: 03/14/2014 Document Reviewed: 01/01/2014 Elsevier Interactive Patient Education 2016 Elsevier Inc.  

## 2015-11-25 NOTE — Progress Notes (Signed)
Pre visit review using our clinic review tool, if applicable. No additional management support is needed unless otherwise documented below in the visit note. 

## 2015-12-14 ENCOUNTER — Telehealth: Payer: Self-pay | Admitting: Family Medicine

## 2015-12-14 NOTE — Telephone Encounter (Signed)
Pt has cpx on 11-25-15 and give examination form to [facility] and the form has not been fax please fax to  [facility] [phone]

## 2015-12-15 NOTE — Telephone Encounter (Signed)
Form has been refaxed.

## 2016-11-24 ENCOUNTER — Encounter: Payer: Self-pay | Admitting: Family Medicine

## 2016-12-05 ENCOUNTER — Encounter: Payer: BLUE CROSS/BLUE SHIELD | Admitting: Family Medicine

## 2016-12-07 ENCOUNTER — Encounter: Payer: Self-pay | Admitting: Family Medicine

## 2016-12-07 ENCOUNTER — Ambulatory Visit (INDEPENDENT_AMBULATORY_CARE_PROVIDER_SITE_OTHER): Payer: BLUE CROSS/BLUE SHIELD | Admitting: Family Medicine

## 2016-12-07 VITALS — BP 110/80 | HR 81 | Temp 98.3°F | Ht 70.5 in | Wt 272.5 lb

## 2016-12-07 DIAGNOSIS — Z8249 Family history of ischemic heart disease and other diseases of the circulatory system: Secondary | ICD-10-CM

## 2016-12-07 DIAGNOSIS — Z23 Encounter for immunization: Secondary | ICD-10-CM

## 2016-12-07 DIAGNOSIS — E8881 Metabolic syndrome: Secondary | ICD-10-CM

## 2016-12-07 DIAGNOSIS — Z0000 Encounter for general adult medical examination without abnormal findings: Secondary | ICD-10-CM

## 2016-12-07 LAB — CBC WITH DIFFERENTIAL/PLATELET
Basophils Absolute: 0 10*3/uL (ref 0.0–0.1)
Basophils Relative: 0.5 % (ref 0.0–3.0)
Eosinophils Absolute: 0.2 10*3/uL (ref 0.0–0.7)
Eosinophils Relative: 2.7 % (ref 0.0–5.0)
HEMATOCRIT: 50.7 % (ref 39.0–52.0)
Hemoglobin: 17.1 g/dL — ABNORMAL HIGH (ref 13.0–17.0)
LYMPHS PCT: 41.7 % (ref 12.0–46.0)
Lymphs Abs: 2.9 10*3/uL (ref 0.7–4.0)
MCHC: 33.7 g/dL (ref 30.0–36.0)
MCV: 87.2 fl (ref 78.0–100.0)
Monocytes Absolute: 0.4 10*3/uL (ref 0.1–1.0)
Monocytes Relative: 6.5 % (ref 3.0–12.0)
NEUTROS ABS: 3.3 10*3/uL (ref 1.4–7.7)
Neutrophils Relative %: 48.6 % (ref 43.0–77.0)
Platelets: 254 10*3/uL (ref 150.0–400.0)
RBC: 5.81 Mil/uL (ref 4.22–5.81)
RDW: 12.9 % (ref 11.5–15.5)
WBC: 6.8 10*3/uL (ref 4.0–10.5)

## 2016-12-07 LAB — HEPATIC FUNCTION PANEL
ALT: 24 U/L (ref 0–53)
AST: 15 U/L (ref 0–37)
Albumin: 4.3 g/dL (ref 3.5–5.2)
Alkaline Phosphatase: 47 U/L (ref 39–117)
Bilirubin, Direct: 0.1 mg/dL (ref 0.0–0.3)
Total Bilirubin: 0.8 mg/dL (ref 0.2–1.2)
Total Protein: 7.6 g/dL (ref 6.0–8.3)

## 2016-12-07 LAB — BASIC METABOLIC PANEL
BUN: 15 mg/dL (ref 6–23)
CALCIUM: 9.4 mg/dL (ref 8.4–10.5)
CO2: 32 mEq/L (ref 19–32)
CREATININE: 0.83 mg/dL (ref 0.40–1.50)
Chloride: 100 mEq/L (ref 96–112)
GFR: 105.6 mL/min (ref >60.00)
Glucose, Bld: 83 mg/dL (ref 70–99)
Potassium: 4.4 mEq/L (ref 3.5–5.1)
Sodium: 139 mEq/L (ref 135–145)

## 2016-12-07 LAB — LIPID PANEL
CHOL/HDL RATIO: 6
Cholesterol: 233 mg/dL — ABNORMAL HIGH (ref 0–200)
HDL: 36.7 mg/dL — ABNORMAL LOW (ref >39.00)
NonHDL: 196.4
Triglycerides: 245 mg/dL — ABNORMAL HIGH (ref 0.0–149.0)
VLDL: 49 mg/dL — ABNORMAL HIGH (ref 0.0–40.0)

## 2016-12-07 LAB — LDL CHOLESTEROL, DIRECT: Direct LDL: 139 mg/dL

## 2016-12-07 LAB — TSH: TSH: 1.96 u[IU]/mL (ref 0.35–4.50)

## 2016-12-07 MED ORDER — SILDENAFIL CITRATE 100 MG PO TABS: 50.0000 mg | ORAL_TABLET | Freq: Every day | ORAL | 11 refills | Status: DC | PRN

## 2016-12-07 NOTE — Progress Notes (Signed)
Subjective:     Patient ID: Scott Mcconnell, male   DOB: [birth year], 46 y.o.   MRN: [id number]  HPI Patient seen for physical exam. He has history of metabolic syndrome. He has unfortunately gained about 5 pounds last year. Increased life stress with family and job. No consistent exercise. Patient is nonsmoker. Takes no regular medications.  Had some issues with erectile dysfunction. Can usually get erection but not maintain. No recent chest pains. Positive family history of type 2 diabetes and CAD in his father early 41s. Patient is contemplating starting more consistent exercise. Has questions about stress testing. He again has had no recent chest pains.  Past Medical History:  Diagnosis Date  . Hyperlipidemia   . HYPERLIPIDEMIA 04/01/2009  . Kidney stones   . Kidney stones   . Sleep apnea   . Sleep apnea    Past Surgical History:  Procedure Laterality Date  . TONSILLECTOMY    . TONSILLECTOMY      reports that he has never smoked. He has never used smokeless tobacco. He reports that he does not drink alcohol or use drugs. family history includes Alcohol abuse in his father, paternal grandfather, and paternal uncle; Alzheimer's disease in his unknown relative; Cancer in his unknown relative; Cancer (age of onset: 33) in his mother; Diabetes in his father; Heart disease (age of onset: 95) in his father; Hyperlipidemia in his father and mother; Hypertension in his father; Hypothyroidism in his sister; Stroke in his unknown relative; Sudden death in his unknown relative. Allergies  Allergen Reactions  . Penicillins     As a child     Review of Systems  Constitutional: Negative for activity change, appetite change, fatigue and fever.  HENT: Negative for congestion, ear pain and trouble swallowing.   Eyes: Negative for pain and visual disturbance.  Respiratory: Negative for cough, shortness of breath and wheezing.   Cardiovascular: Negative for chest pain and palpitations.   Gastrointestinal: Negative for abdominal distention, abdominal pain, blood in stool, constipation, diarrhea, nausea, rectal pain and vomiting.  Genitourinary: Negative for dysuria, hematuria and testicular pain.  Musculoskeletal: Negative for arthralgias and joint swelling.  Skin: Negative for rash.  Neurological: Negative for dizziness, syncope and headaches.  Hematological: Negative for adenopathy.  Psychiatric/Behavioral: Negative for confusion and dysphoric mood.       Objective:   Physical Exam  Constitutional: He is oriented to person, place, and time. He appears well-developed and well-nourished. No distress.  HENT:  Head: Normocephalic and atraumatic.  Right Ear: External ear normal.  Left Ear: External ear normal.  Mouth/Throat: Oropharynx is clear and moist.  Eyes: Pupils are equal, round, and reactive to light. Conjunctivae and EOM are normal.  Neck: Normal range of motion. Neck supple. No thyromegaly present.  Cardiovascular: Normal rate, regular rhythm and normal heart sounds.   No murmur heard. Pulmonary/Chest: No respiratory distress. He has no wheezes. He has no rales.  Abdominal: Soft. Bowel sounds are normal. He exhibits mass. He exhibits no distension. There is no tenderness. There is no rebound and no guarding.  Umbilical hernia soft and nontender  Musculoskeletal: He exhibits no edema.  Lymphadenopathy:    He has no cervical adenopathy.  Neurological: He is alert and oriented to person, place, and time. He displays normal reflexes. No cranial nerve deficit.  Skin: No rash noted.  Psychiatric: He has a normal mood and affect.       Assessment:     Physical exam. Patient has metabolic syndrome and  increased risk for CAD and other complications. He has developed some erectile dysfunction    Plan:     -Strongly advise weight loss as we have done many times in the past -Needs to establish more consistent exercise -Obtain screening lab work -Check EKG=NSR  with no acute changes. -We discussed pros and cons of stress testing. Feel this would be reasonable given his risk factors and also his contemplation of starting exercise program -Trial of Viagra 100 mg one half to one tablet daily as needed for erectile dysfunction symptoms.  Eulas Post MD [facility]

## 2016-12-07 NOTE — Patient Instructions (Signed)
Diet for Metabolic Syndrome Metabolic syndrome is a disorder that includes at least three of these conditions:  Abdominal obesity.  Too much sugar in your blood.  High blood pressure.  Higher than normal amount of fat (lipids) in your blood.  Lower than normal level of "good" cholesterol (HDL).  Following a healthy diet can help to keep metabolic syndrome under control. It can also help to prevent the development of conditions that are associated with metabolic syndrome, such as diabetes, heart disease, and stroke. Along with exercise, a healthy diet:  Helps to improve the way that the body uses insulin.  Promotes weight loss. A common goal for people with this condition is to lose at least 7 to 10 percent of their starting weight.  What do I need to know about this diet?  Use the glycemic index (GI) to plan your meals. The index tells you how quickly a food will raise your blood sugar. Choose foods that have low GI values. These foods take a longer time to raise blood sugar.  Keep track of how many calories you take in. Eating the right amount of calories will help your achieve a healthy weight.  You may want to follow a Mediterranean diet. This diet includes lots of vegetables, lean meats or fish, whole grains, fruits, and healthy oils and fats. What foods can I eat? Grains Stone-ground whole wheat. Pumpernickel bread. Whole-grain bread, crackers, tortillas, cereal, and pasta. Unsweetened oatmeal.Bulgur.Barley.Quinoa.Brown rice or wild rice. Vegetables Lettuce. Spinach. Peas. Beets. Cauliflower. Cabbage. Broccoli. Carrots. Tomatoes. Squash. Eggplant. Herbs. Peppers. Onions. Cucumbers. Brussels sprouts. Sweet potatoes. Yams. Beans. Lentils. Fruits Berries. Apples. Oranges. Grapes. Mango. Pomegranate. Kiwi. Cherries. Meats and Other Protein Sources Seafood and shellfish. Lean meats.Poultry. Tofu. Dairy Low-fat or fat-free dairy products, such as milk, yogurt, and  cheese. Beverages Water. Low-fat milk. Milk alternatives, like soy milk or almond milk. Real fruit juice. Condiments Low-sugar or sugar-free ketchup, barbecue sauce, and mayonnaise. Mustard. Relish. Fats and Oils Avocado. Canola or olive oil. Nuts and nut butters.Seeds. The items listed above may not be a complete list of recommended foods or beverages. Contact your dietitian for more options. What foods are not recommended? Red meat. Palm oil and coconut oil. Processed foods. Fried foods. Alcohol. Sweetened drinks, such as iced tea and soda. Sweets. Salty foods. The items listed above may not be a complete list of foods and beverages to avoid. Contact your dietitian for more information. This information is not intended to replace advice given to you by your health care provider. Make sure you discuss any questions you have with your health care provider. Document Released: 07/08/2014 Document Revised: 07/03/2015 Document Reviewed: 03/05/2014 Elsevier Interactive Patient Education  2018 [place] on coverage for exercise stress testing.

## 2016-12-09 ENCOUNTER — Other Ambulatory Visit: Payer: Self-pay

## 2016-12-09 MED ORDER — ATORVASTATIN CALCIUM 20 MG PO TABS: 20.0000 mg | ORAL_TABLET | Freq: Every day | ORAL | 3 refills | Status: DC

## 2017-01-16 ENCOUNTER — Other Ambulatory Visit: Payer: Self-pay | Admitting: Family Medicine

## 2017-01-16 DIAGNOSIS — E785 Hyperlipidemia, unspecified: Secondary | ICD-10-CM

## 2017-01-19 ENCOUNTER — Other Ambulatory Visit (INDEPENDENT_AMBULATORY_CARE_PROVIDER_SITE_OTHER): Payer: BLUE CROSS/BLUE SHIELD

## 2017-01-19 DIAGNOSIS — E785 Hyperlipidemia, unspecified: Secondary | ICD-10-CM | POA: Diagnosis not present

## 2017-01-19 LAB — LIPID PANEL
CHOLESTEROL: 145 mg/dL (ref 0–200)
HDL: 35.5 mg/dL — AB (ref >39.00)
LDL Cholesterol: 75 mg/dL (ref 0–99)
NonHDL: 109.2
TRIGLYCERIDES: 172 mg/dL — AB (ref 0.0–149.0)
Total CHOL/HDL Ratio: 4
VLDL: 34.4 mg/dL (ref 0.0–40.0)

## 2017-01-19 LAB — HEPATIC FUNCTION PANEL
ALBUMIN: 4.1 g/dL (ref 3.5–5.2)
ALT: 26 U/L (ref 0–53)
AST: 18 U/L (ref 0–37)
Alkaline Phosphatase: 44 U/L (ref 39–117)
BILIRUBIN TOTAL: 0.8 mg/dL (ref 0.2–1.2)
Bilirubin, Direct: 0.1 mg/dL (ref 0.0–0.3)
TOTAL PROTEIN: 7.2 g/dL (ref 6.0–8.3)

## 2017-01-25 DIAGNOSIS — G4733 Obstructive sleep apnea (adult) (pediatric): Secondary | ICD-10-CM | POA: Diagnosis not present

## 2017-12-01 ENCOUNTER — Other Ambulatory Visit: Payer: Self-pay

## 2017-12-01 ENCOUNTER — Ambulatory Visit (INDEPENDENT_AMBULATORY_CARE_PROVIDER_SITE_OTHER): Payer: BLUE CROSS/BLUE SHIELD

## 2017-12-01 ENCOUNTER — Encounter: Payer: Self-pay | Admitting: Family Medicine

## 2017-12-01 ENCOUNTER — Ambulatory Visit: Payer: BLUE CROSS/BLUE SHIELD | Admitting: Family Medicine

## 2017-12-01 VITALS — BP 120/70 | HR 87 | Temp 98.1°F | Ht 70.5 in | Wt 272.4 lb

## 2017-12-01 DIAGNOSIS — R1030 Lower abdominal pain, unspecified: Secondary | ICD-10-CM | POA: Diagnosis not present

## 2017-12-01 DIAGNOSIS — R109 Unspecified abdominal pain: Secondary | ICD-10-CM | POA: Diagnosis not present

## 2017-12-01 DIAGNOSIS — M545 Low back pain, unspecified: Secondary | ICD-10-CM

## 2017-12-01 LAB — POCT URINALYSIS DIPSTICK
Bilirubin, UA: NEGATIVE
Blood, UA: NEGATIVE
Glucose, UA: NEGATIVE
Ketones, UA: NEGATIVE
LEUKOCYTES UA: NEGATIVE
Nitrite, UA: NEGATIVE
PH UA: 6 (ref 5.0–8.0)
Protein, UA: POSITIVE — AB
SPEC GRAV UA: 1.015 (ref 1.010–1.025)
UROBILINOGEN UA: 1 U/dL

## 2017-12-01 NOTE — Patient Instructions (Signed)
Try some heat as discussed  Keep walking- if no associated pain.

## 2017-12-01 NOTE — Progress Notes (Signed)
  Subjective:     Patient ID: Scott Mcconnell, male   DOB: 27-May-1970, 47 y.o.   MRN: [id number]  HPI Patient is seen with 1 month history of pain right lower back area with occasional radiation towards the groin.  Symptoms intermittent for a month.  Actually improved with walking and activity and worse with sitting.  He does have prior history of kidney stone by study back in January 2016.  No stone since then.  Denies any gross hematuria.  No burning with urination.  No fevers or chills.  He was seen by urology back in 2016 and reportedly had stone assessment which proved to be calcium oxalate stones.  Started weight loss program through work and has lost about 10 pounds due to his efforts since then.  Overall feels good.  Good appetite.  No recent injury.  No radiculitis symptoms.  Past Medical History:  Diagnosis Date  . Hyperlipidemia   . HYPERLIPIDEMIA 04/01/2009  . Kidney stones   . Kidney stones   . Sleep apnea   . Sleep apnea    Past Surgical History:  Procedure Laterality Date  . TONSILLECTOMY    . TONSILLECTOMY      reports that he has never smoked. He has never used smokeless tobacco. He reports that he does not drink alcohol or use drugs. family history includes Alcohol abuse in his father, paternal grandfather, and paternal uncle; Alzheimer's disease in his unknown relative; Cancer in his unknown relative; Cancer (age of onset: 95) in his mother; Diabetes in his father; Heart disease (age of onset: 58) in his father; Hyperlipidemia in his father and mother; Hypertension in his father; Hypothyroidism in his sister; Stroke in his unknown relative; Sudden death in his unknown relative. Allergies  Allergen Reactions  . Penicillins     As a child     Review of Systems  Respiratory: Negative for shortness of breath.   Cardiovascular: Negative for chest pain.  Gastrointestinal: Positive for abdominal pain. Negative for abdominal distention and blood in stool.  Genitourinary:  Negative for dysuria and hematuria.  Musculoskeletal: Positive for back pain.  Neurological: Negative for dizziness.       Objective:   Physical Exam  Constitutional: He appears well-developed and well-nourished.  Cardiovascular: Normal rate and regular rhythm.  Pulmonary/Chest: Effort normal and breath sounds normal.  Abdominal: Soft. Bowel sounds are normal. He exhibits no mass. There is no rebound and no guarding.  No reproducible tenderness at this time       Assessment:     1 month history of intermittent pain right lower back area with radiation occasionally toward the groin.  Pain has been somewhat intermittent, sometimes worse with movement which would suggest more likely musculoskeletal origin.  Urine dipstick reveals no blood.  Plain film abdomen no acute abnormalities.  This will be over read    Plan:     -We have recommended symptomatic treatment measures for his back including heat and stretches and continue his regular walking program -Watch for any new urinary symptoms, progressive pain, or other concerns. -touch base if pain not improved in two weeks.  Eulas Post MD [facility] at [facility]

## 2017-12-12 ENCOUNTER — Encounter: Payer: Self-pay | Admitting: Family Medicine

## 2017-12-12 ENCOUNTER — Ambulatory Visit (INDEPENDENT_AMBULATORY_CARE_PROVIDER_SITE_OTHER): Payer: BLUE CROSS/BLUE SHIELD | Admitting: Family Medicine

## 2017-12-12 ENCOUNTER — Other Ambulatory Visit: Payer: Self-pay

## 2017-12-12 VITALS — BP 118/76 | HR 83 | Temp 98.3°F | Ht 70.0 in | Wt 268.8 lb

## 2017-12-12 DIAGNOSIS — Z0000 Encounter for general adult medical examination without abnormal findings: Secondary | ICD-10-CM

## 2017-12-12 DIAGNOSIS — Z23 Encounter for immunization: Secondary | ICD-10-CM

## 2017-12-12 LAB — BASIC METABOLIC PANEL WITH GFR
BUN: 13 mg/dL (ref 6–23)
CO2: 25 meq/L (ref 19–32)
Calcium: 9.3 mg/dL (ref 8.4–10.5)
Chloride: 102 meq/L (ref 96–112)
Creatinine, Ser: 0.89 mg/dL (ref 0.40–1.50)
GFR: 97.01 mL/min
Glucose, Bld: 98 mg/dL (ref 70–99)
Potassium: 4.4 meq/L (ref 3.5–5.1)
Sodium: 139 meq/L (ref 135–145)

## 2017-12-12 LAB — HEPATIC FUNCTION PANEL
ALT: 23 U/L (ref 0–53)
AST: 18 U/L (ref 0–37)
Albumin: 4.3 g/dL (ref 3.5–5.2)
Alkaline Phosphatase: 51 U/L (ref 39–117)
Bilirubin, Direct: 0.2 mg/dL (ref 0.0–0.3)
Total Bilirubin: 1 mg/dL (ref 0.2–1.2)
Total Protein: 7.5 g/dL (ref 6.0–8.3)

## 2017-12-12 LAB — CBC WITH DIFFERENTIAL/PLATELET
Basophils Absolute: 0 10*3/uL (ref 0.0–0.1)
Basophils Relative: 0.7 % (ref 0.0–3.0)
Eosinophils Absolute: 0.1 10*3/uL (ref 0.0–0.7)
Eosinophils Relative: 1.8 % (ref 0.0–5.0)
HCT: 48.3 % (ref 39.0–52.0)
Hemoglobin: 16.6 g/dL (ref 13.0–17.0)
Lymphocytes Relative: 32.7 % (ref 12.0–46.0)
Lymphs Abs: 2.2 10*3/uL (ref 0.7–4.0)
MCHC: 34.4 g/dL (ref 30.0–36.0)
MCV: 86.4 fl (ref 78.0–100.0)
Monocytes Absolute: 0.5 10*3/uL (ref 0.1–1.0)
Monocytes Relative: 7.4 % (ref 3.0–12.0)
Neutro Abs: 3.9 10*3/uL (ref 1.4–7.7)
Neutrophils Relative %: 57.4 % (ref 43.0–77.0)
Platelets: 236 10*3/uL (ref 150.0–400.0)
RBC: 5.59 Mil/uL (ref 4.22–5.81)
RDW: 12.6 % (ref 11.5–15.5)
WBC: 6.9 10*3/uL (ref 4.0–10.5)

## 2017-12-12 LAB — LIPID PANEL
CHOL/HDL RATIO: 5
Cholesterol: 145 mg/dL (ref 0–200)
HDL: 31.4 mg/dL — AB (ref >39.00)
LDL CALC: 89 mg/dL (ref 0–99)
NonHDL: 113.51
Triglycerides: 123 mg/dL (ref 0.0–149.0)
VLDL: 24.6 mg/dL (ref 0.0–40.0)

## 2017-12-12 LAB — TSH: TSH: 2.15 u[IU]/mL (ref 0.35–4.50)

## 2017-12-12 MED ORDER — SILDENAFIL CITRATE 100 MG PO TABS: 50.0000 mg | ORAL_TABLET | Freq: Every day | ORAL | 11 refills | Status: DC | PRN

## 2017-12-12 NOTE — Patient Instructions (Signed)
Continue with weight loss efforts- you are doing a great job thus far!

## 2017-12-12 NOTE — Progress Notes (Signed)
Subjective:     Patient ID: Scott Mcconnell, male   DOB: Jul 01, 1970, 47 y.o.   MRN: [id number]  HPI Patient seen for complete physical.  He has history of obesity and hyperlipidemia.  He has lost about 15 pounds this year due to his efforts.  He is losing about a pound a week by scaling back calories and walking more.  He has obstructive sleep apnea is on CPAP and that seems to be working well.  Hyperlipidemia.  He was started on Lipitor year ago and tolerating well.  Lipids were improved at follow-up.  Family history reviewed.  His sister was diagnosed with breast cancer earlier this year.  No other changes in family history.  Father had premature CAD history.  Patient is non-smoker.  He has had some soreness right sternoclavicular joint near third rib past couple days.  Did some lifting over the weekend but denies specific injury.  Past Medical History:  Diagnosis Date  . Hyperlipidemia   . HYPERLIPIDEMIA 04/01/2009  . Kidney stones   . Kidney stones   . Sleep apnea   . Sleep apnea    Past Surgical History:  Procedure Laterality Date  . TONSILLECTOMY    . TONSILLECTOMY      reports that he has never smoked. He has never used smokeless tobacco. He reports that he does not drink alcohol or use drugs. family history includes Alcohol abuse in his father, paternal grandfather, and paternal uncle; Alzheimer's disease in his unknown relative; Cancer in his unknown relative; Cancer (age of onset: 38) in his mother; Diabetes in his father; Heart disease (age of onset: 41) in his father; Hyperlipidemia in his father and mother; Hypertension in his father; Hypothyroidism in his sister; Stroke in his unknown relative; Sudden death in his unknown relative. Allergies  Allergen Reactions  . Penicillins     As a child     Review of Systems  Constitutional: Negative for activity change, appetite change, fatigue and fever.  HENT: Negative for congestion, ear pain and trouble swallowing.   Eyes:  Negative for pain and visual disturbance.  Respiratory: Negative for cough, shortness of breath and wheezing.   Cardiovascular: Negative for chest pain and palpitations.  Gastrointestinal: Negative for abdominal distention, abdominal pain, blood in stool, constipation, diarrhea, nausea, rectal pain and vomiting.  Genitourinary: Negative for dysuria, hematuria and testicular pain.  Musculoskeletal: Negative for arthralgias and joint swelling.  Skin: Negative for rash.  Neurological: Negative for dizziness, syncope and headaches.  Hematological: Negative for adenopathy.  Psychiatric/Behavioral: Negative for confusion and dysphoric mood.       Objective:   Physical Exam  Constitutional: He is oriented to person, place, and time. He appears well-developed and well-nourished. No distress.  HENT:  Head: Normocephalic and atraumatic.  Right Ear: External ear normal.  Left Ear: External ear normal.  Mouth/Throat: Oropharynx is clear and moist.  Eyes: Pupils are equal, round, and reactive to light. Conjunctivae and EOM are normal.  Neck: Normal range of motion. Neck supple. No thyromegaly present.  Cardiovascular: Normal rate, regular rhythm and normal heart sounds.  No murmur heard. Pulmonary/Chest: No respiratory distress. He has no wheezes. He has no rales.  Abdominal: Soft. Bowel sounds are normal. He exhibits no distension. There is no tenderness. There is no rebound and no guarding.  He has small umbilical hernia which is soft and nontender.  Musculoskeletal: He exhibits no edema.  Lymphadenopathy:    He has no cervical adenopathy.  Neurological: He is alert  and oriented to person, place, and time. He displays normal reflexes. No cranial nerve deficit.  Skin: No rash noted.  Psychiatric: He has a normal mood and affect.       Assessment:     Physical exam.  The following health maintenance issues were addressed    Plan:     -Obtain follow-up labs.  Will include HIV antibody  though he is overall low risk -Flu vaccine given -Refill sildenafil -Continue weight loss efforts -No indications for early PSA or colonoscopy screening based on family history  Eulas Post MD  [facility] at [facility]

## 2017-12-13 ENCOUNTER — Encounter: Payer: Self-pay | Admitting: Family Medicine

## 2017-12-13 ENCOUNTER — Other Ambulatory Visit: Payer: Self-pay | Admitting: Family Medicine

## 2017-12-13 LAB — HIV ANTIBODY (ROUTINE TESTING W REFLEX): HIV 1&2 Ab, 4th Generation: NONREACTIVE

## 2018-12-24 ENCOUNTER — Encounter: Payer: BLUE CROSS/BLUE SHIELD | Admitting: Family Medicine

## 2018-12-28 ENCOUNTER — Other Ambulatory Visit: Payer: Self-pay

## 2018-12-28 ENCOUNTER — Encounter: Payer: Self-pay | Admitting: Family Medicine

## 2018-12-28 ENCOUNTER — Ambulatory Visit (INDEPENDENT_AMBULATORY_CARE_PROVIDER_SITE_OTHER): Payer: BC Managed Care – PPO | Admitting: Family Medicine

## 2018-12-28 VITALS — BP 102/60 | HR 78 | Temp 97.4°F | Resp 16 | Ht 70.0 in | Wt 269.1 lb

## 2018-12-28 DIAGNOSIS — Z23 Encounter for immunization: Secondary | ICD-10-CM

## 2018-12-28 DIAGNOSIS — Z0000 Encounter for general adult medical examination without abnormal findings: Secondary | ICD-10-CM | POA: Diagnosis not present

## 2018-12-28 LAB — BASIC METABOLIC PANEL
BUN: 17 mg/dL (ref 6–23)
CO2: 30 mEq/L (ref 19–32)
Calcium: 9.1 mg/dL (ref 8.4–10.5)
Chloride: 101 mEq/L (ref 96–112)
Creatinine, Ser: 0.93 mg/dL (ref 0.40–1.50)
GFR: 86.38 mL/min (ref >60.00)
Glucose, Bld: 91 mg/dL (ref 70–99)
Potassium: 4.1 mEq/L (ref 3.5–5.1)
Sodium: 140 mEq/L (ref 135–145)

## 2018-12-28 LAB — LIPID PANEL
Cholesterol: 145 mg/dL (ref 0–200)
HDL: 33.2 mg/dL — ABNORMAL LOW (ref >39.00)
LDL Cholesterol: 84 mg/dL (ref 0–99)
NonHDL: 111.65
Total CHOL/HDL Ratio: 4
Triglycerides: 139 mg/dL (ref 0.0–149.0)
VLDL: 27.8 mg/dL (ref 0.0–40.0)

## 2018-12-28 LAB — CBC WITH DIFFERENTIAL/PLATELET
Basophils Absolute: 0 10*3/uL (ref 0.0–0.1)
Basophils Relative: 0.5 % (ref 0.0–3.0)
Eosinophils Absolute: 0.1 10*3/uL (ref 0.0–0.7)
Eosinophils Relative: 2.2 % (ref 0.0–5.0)
HCT: 47.5 % (ref 39.0–52.0)
Hemoglobin: 16.4 g/dL (ref 13.0–17.0)
Lymphocytes Relative: 38.3 % (ref 12.0–46.0)
Lymphs Abs: 2.3 10*3/uL (ref 0.7–4.0)
MCHC: 34.5 g/dL (ref 30.0–36.0)
MCV: 86.5 fl (ref 78.0–100.0)
Monocytes Absolute: 0.5 10*3/uL (ref 0.1–1.0)
Monocytes Relative: 8.2 % (ref 3.0–12.0)
Neutro Abs: 3 10*3/uL (ref 1.4–7.7)
Neutrophils Relative %: 50.8 % (ref 43.0–77.0)
Platelets: 236 10*3/uL (ref 150.0–400.0)
RBC: 5.5 Mil/uL (ref 4.22–5.81)
RDW: 12.7 % (ref 11.5–15.5)
WBC: 5.9 10*3/uL (ref 4.0–10.5)

## 2018-12-28 LAB — HEPATIC FUNCTION PANEL
ALT: 18 U/L (ref 0–53)
AST: 16 U/L (ref 0–37)
Albumin: 4.2 g/dL (ref 3.5–5.2)
Alkaline Phosphatase: 63 U/L (ref 39–117)
Bilirubin, Direct: 0.2 mg/dL (ref 0.0–0.3)
Total Bilirubin: 1.3 mg/dL — ABNORMAL HIGH (ref 0.2–1.2)
Total Protein: 7.3 g/dL (ref 6.0–8.3)

## 2018-12-28 LAB — TSH: TSH: 2.12 u[IU]/mL (ref 0.35–4.50)

## 2018-12-28 MED ORDER — ATORVASTATIN CALCIUM 20 MG PO TABS: 20.0000 mg | ORAL_TABLET | Freq: Every day | ORAL | 3 refills | Status: DC

## 2018-12-28 NOTE — Progress Notes (Signed)
Subjective:     Patient ID: Scott Mcconnell, male   DOB: Jan 01, 1971, 48 y.o.   MRN: [id number]  HPI   Scott Mcconnell is seen for physical exam.  He recently started [employer].  Even though his weight has not gone down much he is losing waist circumference.  Feels good overall.  He has hyperlipidemia treated with Lipitor.  No other regular medications.  Non-smoker.  No regular alcohol use.  Reviewed with no significant changes  Past Medical History:  Diagnosis Date  . Hyperlipidemia   . HYPERLIPIDEMIA 04/01/2009  . Kidney stones   . Kidney stones   . Sleep apnea   . Sleep apnea    Past Surgical History:  Procedure Laterality Date  . TONSILLECTOMY    . TONSILLECTOMY      reports that he has never smoked. He has never used smokeless tobacco. He reports that he does not drink alcohol or use drugs. family history includes Alcohol abuse in his father, paternal grandfather, and paternal uncle; Alzheimer's disease in an other family member; Cancer in an other family member; Cancer (age of onset: 25) in his mother; Diabetes in his father; Heart disease (age of onset: 65) in his father; Hyperlipidemia in his father and mother; Hypertension in his father; Hypothyroidism in his sister; Stroke in an other family member; Sudden death in an other family member. Allergies  Allergen Reactions  . Penicillins     As a child     Review of Systems  Constitutional: Negative for activity change, appetite change, fatigue and fever.  HENT: Negative for congestion, ear pain and trouble swallowing.   Eyes: Negative for pain and visual disturbance.  Respiratory: Negative for cough, shortness of breath and wheezing.   Cardiovascular: Negative for chest pain and palpitations.  Gastrointestinal: Negative for abdominal distention, abdominal pain, blood in stool, constipation, diarrhea, nausea, rectal pain and vomiting.  Endocrine: Negative for polydipsia and polyuria.  Genitourinary: Negative for dysuria, hematuria  and testicular pain.  Musculoskeletal: Negative for arthralgias and joint swelling.  Skin: Negative for rash.  Neurological: Negative for dizziness, syncope and headaches.  Hematological: Negative for adenopathy.  Psychiatric/Behavioral: Negative for confusion and dysphoric mood.       Objective:   Physical Exam Constitutional:      General: He is not in acute distress.    Appearance: He is well-developed.  HENT:     Head: Normocephalic and atraumatic.     Right Ear: External ear normal.     Left Ear: External ear normal.  Eyes:     Conjunctiva/sclera: Conjunctivae normal.     Pupils: Pupils are equal, round, and reactive to light.  Neck:     Musculoskeletal: Normal range of motion and neck supple.     Thyroid: No thyromegaly.  Cardiovascular:     Rate and Rhythm: Normal rate and regular rhythm.     Heart sounds: Normal heart sounds. No murmur.  Pulmonary:     Effort: No respiratory distress.     Breath sounds: No wheezing or rales.  Abdominal:     General: Bowel sounds are normal. There is no distension.     Palpations: Abdomen is soft.     Tenderness: There is no abdominal tenderness. There is no guarding or rebound.     Hernia: A hernia is present.     Comments: Umbilical hernia which is soft and nontender.  He also has diastases recti versus small ventral hernia.  Musculoskeletal:     Right lower leg: No  edema.     Left lower leg: No edema.  Lymphadenopathy:     Cervical: No cervical adenopathy.  Skin:    Findings: No rash.  Neurological:     Mental Status: He is alert and oriented to person, place, and time.     Cranial Nerves: No cranial nerve deficit.     Deep Tendon Reflexes: Reflexes normal.        Assessment:     Physical exam.  Patient has hyperlipidemia treated with Lipitor.  Obesity but recently started CrossFit as above and is losing some weight circumference.  We discussed the following    Plan:     -Flu vaccine given -Tetanus  up-to-date -Discussed prostate and colon cancer screening starting at age 70 -Obtain screening lab work -Continue weight loss efforts -Refilled Lipitor for 1 year -Discussed hernias.  At this point he is minimally symptomatic and does not wish to look at surgical correction  Eulas Post MD [facility] at [facility]

## 2019-06-07 ENCOUNTER — Ambulatory Visit: Payer: Self-pay | Attending: Internal Medicine

## 2019-06-07 DIAGNOSIS — Z23 Encounter for immunization: Secondary | ICD-10-CM

## 2019-06-07 NOTE — Progress Notes (Signed)
   Covid-19 Vaccination Clinic  Name:  Scott Mcconnell    MRN: [id number] DOB: 27-Aug-1970  06/07/2019  Mr. Scott Mcconnell was observed post Covid-19 immunization for 30 minutes based on pre-vaccination screening without incident. He was provided with Vaccine Information Sheet and instruction to access the V-Safe system.   Mr. Scott Mcconnell was instructed to call 911 with any severe reactions post vaccine: Marland Kitchen Difficulty breathing  . Swelling of face and throat  . A fast heartbeat  . A bad rash all over body  . Dizziness and weakness   Immunizations Administered    Name Date Dose VIS Date Route   Pfizer COVID-19 Vaccine 06/07/2019  8:47 AM 0.3 mL 02/15/2019 Intramuscular   Manufacturer: [place]   Lot: [id number]   [place]: [id number]

## 2019-07-01 ENCOUNTER — Telehealth: Payer: Self-pay | Admitting: Family Medicine

## 2019-07-01 ENCOUNTER — Ambulatory Visit: Payer: Self-pay | Attending: Internal Medicine

## 2019-07-01 DIAGNOSIS — Z23 Encounter for immunization: Secondary | ICD-10-CM

## 2019-07-01 MED ORDER — SILDENAFIL CITRATE 100 MG PO TABS: 50.0000 mg | ORAL_TABLET | Freq: Every day | ORAL | 11 refills | Status: DC | PRN

## 2019-07-01 NOTE — Addendum Note (Signed)
Addended by: Modena Morrow R on: 07/01/2019 01:10 PM   Modules accepted: Orders

## 2019-07-01 NOTE — Telephone Encounter (Signed)
Please advise last ordered in 12/30/17

## 2019-07-01 NOTE — Telephone Encounter (Signed)
Refill okay?  

## 2019-07-01 NOTE — Telephone Encounter (Signed)
°  sildenafil (VIAGRA) 100 MG tablet     [id number] [address], [place] Phone:  [phone]  Fax:  [phone]

## 2019-07-01 NOTE — Progress Notes (Signed)
   Covid-19 Vaccination Clinic  Name:  ARICK MAPLES    MRN: [id number] DOB: Dec 04, 1970  07/01/2019  Mr. Elick was observed post Covid-19 immunization for 15 minutes without incident. He was provided with Vaccine Information Sheet and instruction to access the V-Safe system.   Mr. Dondiego was instructed to call 911 with any severe reactions post vaccine: Marland Kitchen Difficulty breathing  . Swelling of face and throat  . A fast heartbeat  . A bad rash all over body  . Dizziness and weakness   Immunizations Administered    Name Date Dose VIS Date Route   Pfizer COVID-19 Vaccine 07/01/2019 11:21 AM 0.3 mL 05/01/2018 Intramuscular   Manufacturer: [place]   Lot: [id number]   [place]: [id number]

## 2019-07-01 NOTE — Telephone Encounter (Signed)
Refill has been sent in.  

## 2019-11-02 ENCOUNTER — Emergency Department (HOSPITAL_COMMUNITY): Payer: No Typology Code available for payment source

## 2019-11-02 ENCOUNTER — Encounter (HOSPITAL_COMMUNITY): Payer: Self-pay

## 2019-11-02 ENCOUNTER — Emergency Department (HOSPITAL_COMMUNITY)
Admission: EM | Admit: 2019-11-02 | Discharge: 2019-11-03 | Disposition: A | Discharge status: Home / Self Care | Payer: No Typology Code available for payment source | Source: Home / Self Care

## 2019-11-02 DIAGNOSIS — R0602 Shortness of breath: Secondary | ICD-10-CM | POA: Insufficient documentation

## 2019-11-02 DIAGNOSIS — R079 Chest pain, unspecified: Secondary | ICD-10-CM | POA: Insufficient documentation

## 2019-11-02 DIAGNOSIS — Z5321 Procedure and treatment not carried out due to patient leaving prior to being seen by health care provider: Secondary | ICD-10-CM | POA: Insufficient documentation

## 2019-11-02 DIAGNOSIS — I2699 Other pulmonary embolism without acute cor pulmonale: Secondary | ICD-10-CM | POA: Diagnosis not present

## 2019-11-02 LAB — CBC
HCT: 48.1 % (ref 39.0–52.0)
Hemoglobin: 15.8 g/dL (ref 13.0–17.0)
MCH: 29.2 pg (ref 26.0–34.0)
MCHC: 32.8 g/dL (ref 30.0–36.0)
MCV: 88.9 fL (ref 80.0–100.0)
Platelets: 209 10*3/uL (ref 150–400)
RBC: 5.41 MIL/uL (ref 4.22–5.81)
RDW: 12.2 % (ref 11.5–15.5)
WBC: 8 10*3/uL (ref 4.0–10.5)
nRBC: 0 % (ref 0.0–0.2)

## 2019-11-02 LAB — BASIC METABOLIC PANEL
Anion gap: 10 (ref 5–15)
BUN: 15 mg/dL (ref 6–20)
CO2: 28 mmol/L (ref 22–32)
Calcium: 9.2 mg/dL (ref 8.9–10.3)
Chloride: 103 mmol/L (ref 98–111)
Creatinine, Ser: 1.1 mg/dL (ref 0.61–1.24)
GFR calc Af Amer: >60 mL/min (ref >60)
GFR calc non Af Amer: >60 mL/min (ref >60)
Glucose, Bld: 123 mg/dL — ABNORMAL HIGH (ref 70–99)
Potassium: 3.7 mmol/L (ref 3.5–5.1)
Sodium: 141 mmol/L (ref 135–145)

## 2019-11-02 LAB — TROPONIN I (HIGH SENSITIVITY): Troponin I (High Sensitivity): 7 ng/L (ref <18)

## 2019-11-02 NOTE — ED Triage Notes (Signed)
Pt states that he began to have CP his afternoon with SOB, radiation to back, diaphoretic, denies n/v

## 2019-11-03 LAB — TROPONIN I (HIGH SENSITIVITY): Troponin I (High Sensitivity): 5 ng/L (ref <18)

## 2019-11-03 NOTE — ED Notes (Signed)
Pt up to desks stating that he will be leaving. Seen leaving ED.

## 2019-11-04 ENCOUNTER — Inpatient Hospital Stay (HOSPITAL_BASED_OUTPATIENT_CLINIC_OR_DEPARTMENT_OTHER)
Admission: EM | Admit: 2019-11-04 | Discharge: 2019-11-07 | DRG: 176 | Disposition: A | Discharge status: Home / Self Care | Payer: No Typology Code available for payment source | Attending: Internal Medicine | Admitting: Internal Medicine

## 2019-11-04 ENCOUNTER — Ambulatory Visit: Payer: No Typology Code available for payment source | Admitting: Family Medicine

## 2019-11-04 ENCOUNTER — Ambulatory Visit (HOSPITAL_BASED_OUTPATIENT_CLINIC_OR_DEPARTMENT_OTHER)
Admission: RE | Admit: 2019-11-04 | Discharge: 2019-11-04 | Disposition: A | Discharge status: Home / Self Care | Payer: No Typology Code available for payment source | Source: Ambulatory Visit | Attending: Family Medicine | Admitting: Family Medicine

## 2019-11-04 ENCOUNTER — Other Ambulatory Visit: Payer: Self-pay

## 2019-11-04 ENCOUNTER — Encounter: Payer: Self-pay | Admitting: Family Medicine

## 2019-11-04 ENCOUNTER — Ambulatory Visit (HOSPITAL_BASED_OUTPATIENT_CLINIC_OR_DEPARTMENT_OTHER): Payer: No Typology Code available for payment source

## 2019-11-04 ENCOUNTER — Encounter (HOSPITAL_BASED_OUTPATIENT_CLINIC_OR_DEPARTMENT_OTHER): Payer: Self-pay | Admitting: Emergency Medicine

## 2019-11-04 VITALS — BP 114/68 | HR 79 | Temp 97.9°F | Ht 70.0 in | Wt 264.0 lb

## 2019-11-04 DIAGNOSIS — R079 Chest pain, unspecified: Secondary | ICD-10-CM

## 2019-11-04 DIAGNOSIS — E785 Hyperlipidemia, unspecified: Secondary | ICD-10-CM | POA: Diagnosis not present

## 2019-11-04 DIAGNOSIS — I82442 Acute embolism and thrombosis of left tibial vein: Secondary | ICD-10-CM | POA: Diagnosis not present

## 2019-11-04 DIAGNOSIS — Z79899 Other long term (current) drug therapy: Secondary | ICD-10-CM

## 2019-11-04 DIAGNOSIS — I82452 Acute embolism and thrombosis of left peroneal vein: Secondary | ICD-10-CM | POA: Diagnosis present

## 2019-11-04 DIAGNOSIS — I2694 Multiple subsegmental pulmonary emboli without acute cor pulmonale: Secondary | ICD-10-CM

## 2019-11-04 DIAGNOSIS — E669 Obesity, unspecified: Secondary | ICD-10-CM | POA: Diagnosis not present

## 2019-11-04 DIAGNOSIS — Z6836 Body mass index (BMI) 36.0-36.9, adult: Secondary | ICD-10-CM

## 2019-11-04 DIAGNOSIS — I2699 Other pulmonary embolism without acute cor pulmonale: Secondary | ICD-10-CM | POA: Diagnosis present

## 2019-11-04 DIAGNOSIS — G4733 Obstructive sleep apnea (adult) (pediatric): Secondary | ICD-10-CM | POA: Diagnosis present

## 2019-11-04 DIAGNOSIS — Z791 Long term (current) use of non-steroidal anti-inflammatories (NSAID): Secondary | ICD-10-CM | POA: Diagnosis not present

## 2019-11-04 DIAGNOSIS — R0789 Other chest pain: Secondary | ICD-10-CM

## 2019-11-04 DIAGNOSIS — Z20822 Contact with and (suspected) exposure to covid-19: Secondary | ICD-10-CM | POA: Diagnosis present

## 2019-11-04 DIAGNOSIS — I10 Essential (primary) hypertension: Secondary | ICD-10-CM | POA: Diagnosis present

## 2019-11-04 LAB — COMPREHENSIVE METABOLIC PANEL
ALT: 21 U/L (ref 0–44)
AST: 21 U/L (ref 15–41)
Albumin: 3.9 g/dL (ref 3.5–5.0)
Alkaline Phosphatase: 59 U/L (ref 38–126)
Anion gap: 7 (ref 5–15)
BUN: 12 mg/dL (ref 6–20)
CO2: 29 mmol/L (ref 22–32)
Calcium: 8.6 mg/dL — ABNORMAL LOW (ref 8.9–10.3)
Chloride: 99 mmol/L (ref 98–111)
Creatinine, Ser: 0.98 mg/dL (ref 0.61–1.24)
GFR calc Af Amer: >60 mL/min (ref >60)
GFR calc non Af Amer: >60 mL/min (ref >60)
Glucose, Bld: 89 mg/dL (ref 70–99)
Potassium: 3.8 mmol/L (ref 3.5–5.1)
Sodium: 135 mmol/L (ref 135–145)
Total Bilirubin: 0.8 mg/dL (ref 0.3–1.2)
Total Protein: 7.7 g/dL (ref 6.5–8.1)

## 2019-11-04 LAB — CBC WITH DIFFERENTIAL/PLATELET
Abs Immature Granulocytes: 0.02 10*3/uL (ref 0.00–0.07)
Basophils Absolute: 0 10*3/uL (ref 0.0–0.1)
Basophils Relative: 1 %
Eosinophils Absolute: 0.3 10*3/uL (ref 0.0–0.5)
Eosinophils Relative: 3 %
HCT: 47.9 % (ref 39.0–52.0)
Hemoglobin: 16 g/dL (ref 13.0–17.0)
Immature Granulocytes: 0 %
Lymphocytes Relative: 39 %
Lymphs Abs: 3.1 10*3/uL (ref 0.7–4.0)
MCH: 28.8 pg (ref 26.0–34.0)
MCHC: 33.4 g/dL (ref 30.0–36.0)
MCV: 86.2 fL (ref 80.0–100.0)
Monocytes Absolute: 0.7 10*3/uL (ref 0.1–1.0)
Monocytes Relative: 9 %
Neutro Abs: 3.7 10*3/uL (ref 1.7–7.7)
Neutrophils Relative %: 48 %
Platelets: 206 10*3/uL (ref 150–400)
RBC: 5.56 MIL/uL (ref 4.22–5.81)
RDW: 12 % (ref 11.5–15.5)
WBC: 7.9 10*3/uL (ref 4.0–10.5)
nRBC: 0 % (ref 0.0–0.2)

## 2019-11-04 LAB — TROPONIN I (HIGH SENSITIVITY)
Troponin I (High Sensitivity): 5 ng/L (ref <18)
Troponin I (High Sensitivity): 5 ng/L (ref <18)

## 2019-11-04 LAB — PROTIME-INR
INR: 1.1 (ref 0.8–1.2)
Prothrombin Time: 13.4 seconds (ref 11.4–15.2)

## 2019-11-04 LAB — D-DIMER, QUANTITATIVE: D-Dimer, Quant: 2.95 mcg/mL FEU — ABNORMAL HIGH (ref <0.50)

## 2019-11-04 LAB — APTT: aPTT: 33 seconds (ref 24–36)

## 2019-11-04 LAB — SARS CORONAVIRUS 2 BY RT PCR (HOSPITAL ORDER, PERFORMED IN ~~LOC~~ HOSPITAL LAB): SARS Coronavirus 2: NEGATIVE

## 2019-11-04 MED ORDER — HEPARIN BOLUS VIA INFUSION
6000.0000 [IU] | Freq: Once | INTRAVENOUS | Status: AC
  Administered 2019-11-04: 6000 [IU] via INTRAVENOUS

## 2019-11-04 MED ORDER — HEPARIN (PORCINE) 25000 UT/250ML-% IV SOLN
2050.0000 [IU]/h | INTRAVENOUS | Status: AC
  Administered 2019-11-04: 1600 [IU]/h via INTRAVENOUS
  Administered 2019-11-05 (×2): 1850 [IU]/h via INTRAVENOUS
  Filled 2019-11-04 (×4): qty 250

## 2019-11-04 MED ORDER — IOHEXOL 350 MG/ML SOLN
100.0000 mL | Freq: Once | INTRAVENOUS | Status: AC | PRN
  Administered 2019-11-04: 100 mL via INTRAVENOUS

## 2019-11-04 MED ORDER — SODIUM CHLORIDE 0.9 % IV SOLN: Freq: Once | INTRAVENOUS | Status: AC

## 2019-11-04 NOTE — ED Provider Notes (Signed)
[name] EMERGENCY DEPARTMENT Provider Note   CSN: [id number] Arrival date & time: 11/04/19  1725     History Chief Complaint  Patient presents with  . Shortness of Breath    bilateral PE    Scott Mcconnell is a 49 y.o. male with history significant for hyperlipidemia, hypertension, OSA who presents for evaluation of shortness of breath and chest pain.  States on Saturday he was doing some yard work.  States afterwards he felt very tired.  He was fatigued.  States after that he noticed some left-sided pain.  This was worse when he took a deep breath.  He states he did not fall or injure himself while he was doing yard work.  He has never had prior exertional chest pain.  He did not notice any lateral leg swelling, redness or warmth.  No cough.  Denies any recent Covid exposures.  Denies fever, chills, nausea, vomiting, abdominal pain, diarrhea, dysuria.  He was seen in the emergency department at [facility] yesterday however left due to the extended wait time.  He was not evaluated by provider.  Patient was then seen today by his PCP who ordered a D-dimer.  This was positive and patient was sent for CTA of his chest.  Is noted to be positive for bilateral PEs.  Patient denies any personal history of clotting disorders, PE or DVTs.  However state his father does have a history of multiple PEs as well as CAD at an early age into his 33s.  Patient states he has never had any cardiac or pulmonary work-up previously.   He rates his current pain a 3/10.  Area located to his left posterior thoracic back.  Denies additional aggravating or alleviating factors.  History obtained from patient and past medical record.  No interpreter is used.  HPI     Past Medical History:  Diagnosis Date  . Hyperlipidemia   . HYPERLIPIDEMIA 04/01/2009  . Kidney stones   . Kidney stones   . Sleep apnea   . Sleep apnea     Patient Active Problem List   Diagnosis Date Noted  . Acute pulmonary  embolism ([place]) 11/04/2019  . Obesity (BMI 30-39.9) 11/26/2012  . OSA (obstructive sleep apnea) 02/08/2012  . HYPERLIPIDEMIA 04/01/2009    Past Surgical History:  Procedure Laterality Date  . TONSILLECTOMY    . TONSILLECTOMY         Family History  Problem Relation Age of Onset  . Hyperlipidemia Mother   . Cancer Mother 41       Bilateral Breast Cancer  . Alcohol abuse Father   . Hyperlipidemia Father   . Hypertension Father   . Heart disease Father 78       CAD  . Diabetes Father        grandfather  . Alcohol abuse Paternal Uncle   . Alcohol abuse Paternal Grandfather   . Cancer Other        prostate/grandfather, colon/grandmother  . Alzheimer's disease Other        grandmother  . Sudden death Other        grandfather  . Stroke Other        grandfather  . Hypothyroidism Sister     Social History   Tobacco Use  . Smoking status: Never Smoker  . Smokeless tobacco: Never Used  Vaping Use  . Vaping Use: Never used  Substance Use Topics  . Alcohol use: No  . Drug use: No  Home Medications Prior to Admission medications   Medication Sig Start Date End Date Taking? Authorizing Provider  atorvastatin (LIPITOR) 20 MG tablet Take 1 tablet (20 mg total) by mouth daily. 12/28/18  Yes Burchette, Alinda Sierras, MD  meloxicam (MOBIC) 15 MG tablet Take by mouth. 10/05/19  Yes [provider]  sildenafil (VIAGRA) 100 MG tablet Take 0.5-1 tablets (50-100 mg total) by mouth daily as needed for erectile dysfunction. 07/01/19  Yes Burchette, Alinda Sierras, MD    Allergies    Penicillins  Review of Systems   Review of Systems  Constitutional: Negative.   HENT: Negative.   Respiratory: Positive for shortness of breath. Negative for apnea, cough, choking, chest tightness, wheezing and stridor.   Cardiovascular: Positive for chest pain. Negative for palpitations and leg swelling.  Gastrointestinal: Negative.   Genitourinary: Negative.   Musculoskeletal: Negative.   Skin:  Negative.   Neurological: Negative.   All other systems reviewed and are negative.   Physical Exam Updated Vital Signs BP 130/75 (BP Location: Left Arm)   Pulse 82   Temp 98.2 F (36.8 C) (Oral)   Resp (!) 22   Ht 5\' 11"  (1.803 m)   Wt 122.5 kg   SpO2 97%   BMI 37.67 kg/m   Physical Exam Vitals and nursing note reviewed.  Constitutional:      General: He is not in acute distress.    Appearance: He is well-developed. He is not ill-appearing, toxic-appearing or diaphoretic.  HENT:     Head: Normocephalic and atraumatic.     Jaw: There is normal jaw occlusion.     Nose: Nose normal.  Eyes:     Extraocular Movements: Extraocular movements intact.     Pupils: Pupils are equal, round, and reactive to light.  Neck:     Vascular: No carotid bruit or JVD.     Trachea: Trachea and phonation normal.     Meningeal: Brudzinski's sign and Kernig's sign absent.  Cardiovascular:     Rate and Rhythm: Normal rate and regular rhythm.     Pulses: Normal pulses.          Radial pulses are 2+ on the right side and 2+ on the left side.       Posterior tibial pulses are 2+ on the right side and 2+ on the left side.     Heart sounds: Normal heart sounds.  Pulmonary:     Effort: Pulmonary effort is normal. No respiratory distress.     Breath sounds: Normal breath sounds and air entry.  Chest:     Chest wall: No deformity, swelling, tenderness, crepitus or edema.  Abdominal:     General: There is no distension.     Palpations: Abdomen is soft.     Tenderness: There is no abdominal tenderness.     Comments: Periumbilical hernia reducible   Musculoskeletal:        General: Normal range of motion.     Cervical back: Full passive range of motion without pain, normal range of motion and neck supple.     Right lower leg: No tenderness. No edema.     Left lower leg: No tenderness. No edema.  Feet:     Right foot:     Skin integrity: Skin integrity normal.     Left foot:     Skin integrity:  Skin integrity normal.  Skin:    General: Skin is warm and dry.     Capillary Refill: Capillary refill takes less than  2 seconds.  Neurological:     General: No focal deficit present.     Mental Status: He is alert and oriented to person, place, and time.    ED Results / Procedures / Treatments   Labs (all labs ordered are listed, but only abnormal results are displayed) Labs Reviewed  COMPREHENSIVE METABOLIC PANEL - Abnormal; Notable for the following components:      Result Value   Calcium 8.6 (*)    All other components within normal limits  SARS CORONAVIRUS 2 BY RT PCR (HOSPITAL ORDER, [facility])  CBC WITH DIFFERENTIAL/PLATELET  PROTIME-INR  APTT  HEPARIN LEVEL (UNFRACTIONATED)  TROPONIN I (HIGH SENSITIVITY)  TROPONIN I (HIGH SENSITIVITY)    EKG None  Radiology DG Chest 2 View  Result Date: 11/02/2019 CLINICAL DATA:  Chest pain. EXAM: CHEST - 2 VIEW COMPARISON:  None. FINDINGS: Mild atelectasis and/or infiltrate is seen within the left lung base. There is no evidence of a pleural effusion or pneumothorax. The heart size and mediastinal contours are within normal limits. The visualized skeletal structures are unremarkable. IMPRESSION: Mild left basilar atelectasis and/or infiltrate. Electronically Signed   By: Virgina Norfolk M.D.   On: 11/02/2019 23:00   CT ANGIO CHEST PE W OR WO CONTRAST  Result Date: 11/04/2019 CLINICAL DATA:  PE suspected EXAM: CT ANGIOGRAPHY CHEST WITH CONTRAST TECHNIQUE: Multidetector CT imaging of the chest was performed using the standard protocol during bolus administration of intravenous contrast. Multiplanar CT image reconstructions and MIPs were obtained to evaluate the vascular anatomy. CONTRAST:  17mL OMNIPAQUE IOHEXOL 350 MG/ML SOLN COMPARISON:  None. FINDINGS: Cardiovascular: Satisfactory opacification of the pulmonary arteries to the segmental level. Positive examination for pulmonary embolism with a relatively  large burden of embolus present in the lobar and more distal vessels, right greater than left. The most proximal and most occlusive embolus is present in the right upper lobe pulmonary artery. The main pulmonary artery is enlarged, measuring up to 3.6 cm in caliber. The RV LV ratio is preserved, approximately 0.9. Normal heart size. No pericardial effusion. Mediastinum/Nodes: No enlarged mediastinal, hilar, or axillary lymph nodes. Thyroid gland, trachea, and esophagus demonstrate no significant findings. Lungs/Pleura: Scarring of the bilateral lung bases trace left pleural effusion. Upper Abdomen: No acute abnormality. Musculoskeletal: No chest wall abnormality. No acute or significant osseous findings. Review of the MIP images confirms the above findings. IMPRESSION: 1. Positive examination for pulmonary embolism with a relatively large burden of embolus present in the lobar and more distal vessels, right greater than left. 2. The main pulmonary artery is enlarged, measuring up to 3.6 cm in caliber, which can be seen in pulmonary hypertension. 3. The RV LV ratio is preserved, approximately 0.9. 4. Trace left pleural effusion, likely reactive. These results were called by telephone at the time of interpretation on 11/04/2019 at 4:55 pm to Dr. Carolann Littler , who verbally acknowledged these results. Electronically Signed   By: Eddie Candle M.D.   On: 11/04/2019 17:07   Procedures .Critical Care Performed by: Nettie Elm, PA-C Authorized by: Nettie Elm, PA-C   Critical care provider statement:    Critical care time (minutes):  45   Critical care was necessary to treat or prevent imminent or life-threatening deterioration of the following conditions:  Circulatory failure   Critical care was time spent personally by me on the following activities:  Discussions with consultants, evaluation of patient's response to treatment, examination of patient, ordering and performing treatments and  interventions, ordering and review of laboratory studies, ordering and review of radiographic studies, pulse oximetry, re-evaluation of patient's condition, obtaining history from patient or surrogate and review of old charts   (including critical care time)  Medications Ordered in ED Medications  heparin ADULT infusion 100 units/mL (25000 units/262mL sodium chloride 0.45%) (1,600 Units/hr Intravenous New Bag/Given 11/04/19 1817)  0.9 %  sodium chloride infusion ( Intravenous New Bag/Given 11/04/19 1805)  heparin bolus via infusion 6,000 Units (6,000 Units Intravenous Bolus from Bag 11/04/19 1818)   ED Course  I have reviewed the triage vital signs and the nursing notes.  Pertinent labs & imaging results that were available during my care of the patient were reviewed by me and considered in my medical decision making (see chart for details).  2 old presents for evaluation of chest pain and shortness of breath.  Had outpatient CT scan which showed bilateral PE.  He has no history of clotting disorders, PE or DVT however he does have family history of PE.  Patient without any tachycardia, hypoxia.  Shortness of breath worse with exertion.  He looks stable.  Clinically no evidence of DVT on exam.  He is intermittently tachypneic however does not appear in any respiratory distress.  His abdomen soft, nontender.  His heart and lungs are clear.  Imaging from prior study reviewed.  Plan on labs and reassess  CBC without leukocytosis Metabolic panel with mild hypokalemia dysemia to 8.6 Troponin V Covid negative EKG without STEMI  Discussed results with patient.  Will need admission as patient critically ill with bilateral pulmonary embolisms.  Heparin started in ED and will admit for further work-up.  No saddle PE.  No heart strain on CT scan. Hemodynamically stable.  CONSULT with Dr. Hal Hope  with [facility] who agrees accept patient in transfer and will place bed assignment.  The patient appears  reasonably stabilized for admission considering the current resources, flow, and capabilities available in the ED at this time, and I doubt any other [facility] requiring further screening and/or treatment in the ED prior to admission.  Patient discussed with attending, Dr. Sherry Ruffing who agrees with above treatment, plan and disposition    MDM Rules/Calculators/A&P                           Final Clinical Impression(s) / ED Diagnoses Final diagnoses:  Multiple subsegmental pulmonary emboli without acute cor pulmonale [facility])    Rx / DC Orders ED Discharge Orders    None       Luster Hechler A, PA-C 11/04/19 2102    Tegeler, Gwenyth Allegra, MD 11/04/19 2232

## 2019-11-04 NOTE — ED Triage Notes (Signed)
Pt reports shortness of breath x 2 days , positive bilateral PE today in CT. Left upper back pain. Alert and oriented x 4.

## 2019-11-04 NOTE — Patient Instructions (Signed)
We are checking D-dimer level to help rule out pulmonary embolus  If shortness of breath or chest pain severe go back to ER  I will set up Cardiology evaluation to further evaluate.

## 2019-11-04 NOTE — Progress Notes (Signed)
ANTICOAGULATION CONSULT NOTE - Initial Consult  Pharmacy Consult for heparin Indication: pulmonary embolus  Allergies  Allergen Reactions  . Penicillins     As a child    Patient Measurements: Height: 5\' 11"  (180.3 cm) Weight: 122.5 kg (270 lb 1.6 oz) IBW/kg (Calculated) : 75.3 Heparin Dosing Weight: 101.8kg  Vital Signs: Temp: 98.2 F (36.8 C) (08/30 1743) Temp Source: Oral (08/30 1743) BP: 145/86 (08/30 1743) Pulse Rate: 82 (08/30 1743)  Labs: Recent Labs    11/02/19 2244 11/03/19 0058  HGB 15.8  --   HCT 48.1  --   PLT 209  --   CREATININE 1.10  --   TROPONINIHS 7 5    Estimated Creatinine Clearance: 108.2 mL/min (by C-G formula based on SCr of 1.1 mg/dL).   Medical History: Past Medical History:  Diagnosis Date  . Hyperlipidemia   . HYPERLIPIDEMIA 04/01/2009  . Kidney stones   . Kidney stones   . Sleep apnea   . Sleep apnea     Medications:  Infusions:  . sodium chloride    . heparin      Assessment: 83 yom presented to the ED with SOB and bilateral PE. To start IV heparin. Baseline CBC was WNL as of 8/28 and he is not on anticoagulation PTA.   Goal of Therapy:  Heparin level 0.3-0.7 units/ml Monitor platelets by anticoagulation protocol: Yes   Plan:  Heparin bolus 6000 units IV x 1 Heparin gtt 1600 units/hr Check a 6 hr heparin level Daily heparin level and CBC  Hermon Zea, Rande Lawman 11/04/2019,5:58 PM

## 2019-11-04 NOTE — Progress Notes (Addendum)
Established Patient Office Visit  Subjective:  Patient ID: Scott Mcconnell, male    DOB: 08/04/1970  Age: 49 y.o. MRN: [id number]  CC:  Chief Complaint  Patient presents with  . Chest Pain  . Back Pain    HPI Scott Mcconnell presents for ER follow-up.  He relates that Saturday he did strenuous yard work basically all morning and after he went in and got cleaned up around 1 PM he states he felt "worn out ".  He was extremely fatigued and wondered if this was some heat exhaustion.  He laid down for a short while and when laying down noticed some left chest wall pain that seem to be worse with deep breathing.  He had denied any injury.  No cough.  No fever.  No nausea or vomiting.  No exertional pain.  He went to the ER and had evaluation there including chest x-ray which showed question of some atelectasis left base.  EKG no acute findings.  Troponins were normal.  CBC and chemistries unremarkable.  He waited in the ER for several hours and had not been seen yet and ended up leaving.  He states he had no exertional chest pains whatsoever.  His mowing involved several areas of pushing up hills with no associated pain.  He has not had any recent calf or leg pain.  Does reportedly have family history of pulmonary embolus in his dad also his dad had coronary disease in his late 70s or early 30s.  [name] never had any cardiac work-up previously.  He is non-smoker.  No history of diabetes.  His left-sided chest pain is slightly tender to palpation.  Past Medical History:  Diagnosis Date  . Hyperlipidemia   . HYPERLIPIDEMIA 04/01/2009  . Kidney stones   . Kidney stones   . Sleep apnea   . Sleep apnea     Past Surgical History:  Procedure Laterality Date  . TONSILLECTOMY    . TONSILLECTOMY      Family History  Problem Relation Age of Onset  . Hyperlipidemia Mother   . Cancer Mother 73       Bilateral Breast Cancer  . Alcohol abuse Father   . Hyperlipidemia Father   . Hypertension  Father   . Heart disease Father 75       CAD  . Diabetes Father        grandfather  . Alcohol abuse Paternal Uncle   . Alcohol abuse Paternal Grandfather   . Cancer Other        prostate/grandfather, colon/grandmother  . Alzheimer's disease Other        grandmother  . Sudden death Other        grandfather  . Stroke Other        grandfather  . Hypothyroidism Sister     Social History   Socioeconomic History  . Marital status: Married    Spouse name: Not on file  . Number of children: Not on file  . Years of education: Not on file  . Highest education level: Not on file  Occupational History  . Not on file  Tobacco Use  . Smoking status: Never Smoker  . Smokeless tobacco: Never Used  Vaping Use  . Vaping Use: Never used  Substance and Sexual Activity  . Alcohol use: No  . Drug use: No  . Sexual activity: Not on file  Other Topics Concern  . Not on file  Social History Narrative  . Not on  file   Social Determinants of Health   Financial Resource Strain:   . Difficulty of Paying Living Expenses: Not on file  Food Insecurity:   . Worried About Charity fundraiser in the Last Year: Not on file  . Ran Out of Food in the Last Year: Not on file  Transportation Needs:   . Lack of Transportation (Medical): Not on file  . Lack of Transportation (Non-Medical): Not on file  Physical Activity:   . Days of Exercise per Week: Not on file  . Minutes of Exercise per Session: Not on file  Stress:   . Feeling of Stress : Not on file  Social Connections:   . Frequency of Communication with Friends and Family: Not on file  . Frequency of Social Gatherings with Friends and Family: Not on file  . Attends Religious Services: Not on file  . Active Member of Clubs or Organizations: Not on file  . Attends Archivist Meetings: Not on file  . Marital Status: Not on file  Intimate Partner Violence:   . Fear of Current or Ex-Partner: Not on file  . Emotionally Abused: Not  on file  . Physically Abused: Not on file  . Sexually Abused: Not on file    Outpatient Medications Prior to Visit  Medication Sig Dispense Refill  . atorvastatin (LIPITOR) 20 MG tablet Take 1 tablet (20 mg total) by mouth daily. 90 tablet 3  . sildenafil (VIAGRA) 100 MG tablet Take 0.5-1 tablets (50-100 mg total) by mouth daily as needed for erectile dysfunction. 6 tablet 11   No facility-administered medications prior to visit.    Allergies  Allergen Reactions  . Penicillins     As a child    ROS Review of Systems  Constitutional: Negative for chills and fever.  Respiratory: Positive for shortness of breath. Negative for cough and wheezing.   Cardiovascular: Positive for chest pain. Negative for palpitations and leg swelling.  Gastrointestinal: Negative for nausea and vomiting.  Neurological: Negative for dizziness and syncope.      Objective:    Physical Exam Vitals reviewed.  Constitutional:      Appearance: He is well-developed.  Cardiovascular:     Heart sounds: Normal heart sounds.  Pulmonary:     Effort: Pulmonary effort is normal.     Breath sounds: Normal breath sounds.  Abdominal:     Palpations: Abdomen is soft.     Tenderness: There is no abdominal tenderness.  Musculoskeletal:     Right lower leg: No edema.     Left lower leg: No edema.  Neurological:     Mental Status: He is alert.     BP 114/68   Pulse 79   Temp 97.9 F (36.6 C) (Oral)   Ht 5\' 10"  (1.778 m)   Wt 264 lb (119.7 kg)   SpO2 95%   BMI 37.88 kg/m  Wt Readings from Last 3 Encounters:  11/04/19 264 lb (119.7 kg)  12/28/18 269 lb 1.6 oz (122.1 kg)  12/12/17 268 lb 12.8 oz (121.9 kg)     Health Maintenance Due  Topic Date Due  . Hepatitis C Screening  Never done  . INFLUENZA VACCINE  10/06/2019    There are no preventive care reminders to display for this patient.  Lab Results  Component Value Date   TSH 2.12 12/28/2018   Lab Results  Component Value Date   WBC  8.0 11/02/2019   HGB 15.8 11/02/2019   HCT 48.1 11/02/2019  MCV 88.9 11/02/2019   PLT 209 11/02/2019   Lab Results  Component Value Date   NA 141 11/02/2019   K 3.7 11/02/2019   CO2 28 11/02/2019   GLUCOSE 123 (H) 11/02/2019   BUN 15 11/02/2019   CREATININE 1.10 11/02/2019   BILITOT 1.3 (H) 12/28/2018   ALKPHOS 63 12/28/2018   AST 16 12/28/2018   ALT 18 12/28/2018   PROT 7.3 12/28/2018   ALBUMIN 4.2 12/28/2018   CALCIUM 9.2 11/02/2019   ANIONGAP 10 11/02/2019   GFR 86.38 12/28/2018   Lab Results  Component Value Date   CHOL 145 12/28/2018   Lab Results  Component Value Date   HDL 33.20 (L) 12/28/2018   Lab Results  Component Value Date   LDLCALC 84 12/28/2018   Lab Results  Component Value Date   TRIG 139.0 12/28/2018   Lab Results  Component Value Date   CHOLHDL 4 12/28/2018   No results found for: HGBA1C    Assessment & Plan:   Problem List Items Addressed This Visit    None    Visit Diagnoses    Chest pain, unspecified type    -  Primary   Relevant Orders   D-dimer, Quantitative    Patient presented with somewhat atypical chest pain to ER and had partial work-up as above which was unrevealing-but left before visit was completed.Marland Kitchen  Atypical features include the fact that his symptoms have been relatively continuous since onset and also some soreness to touch left chest wall and nonexertional.  Does have family history of pulmonary embolus reported in dad.  Pulse oximetry 95%.  He is in no distress at rest.  No evidence for any calf or leg edema or calf tenderness.  Will check D-dimer.  We did explain nonspecificity of D-dimer but that negative D-dimer would be very useful but if elevated would certainly pursue CT angiogram to further assess.  D-dimer was order stat.  He knows to go to the ER if he has any increased shortness of breath or increased chest pain.  He is hemodynamically stable at this time does not have any tachypnea or significant elevated  pulse  We also discussed further cardiac work-up if above negative given his family history of CAD and patient agrees-even though current symptoms do not suggest angina  No orders of the defined types were placed in this encounter.   Follow-up: No follow-ups on file.    Carolann Littler, MD   Addendum: Received verbal report from radiology that patient has bilateral pulmonary emboli.  We recommend EMS transport to [facility] or [facility] for admission and further evaluation.  I spoke with patient and he is aware.  He is symptomatically stable at this time.  Eulas Post MD [facility] at [facility]

## 2019-11-05 ENCOUNTER — Encounter (HOSPITAL_COMMUNITY): Payer: No Typology Code available for payment source

## 2019-11-05 ENCOUNTER — Other Ambulatory Visit: Payer: No Typology Code available for payment source

## 2019-11-05 ENCOUNTER — Encounter (HOSPITAL_COMMUNITY): Payer: Self-pay | Admitting: Internal Medicine

## 2019-11-05 ENCOUNTER — Observation Stay (HOSPITAL_COMMUNITY): Payer: No Typology Code available for payment source

## 2019-11-05 DIAGNOSIS — Z20822 Contact with and (suspected) exposure to covid-19: Secondary | ICD-10-CM | POA: Diagnosis present

## 2019-11-05 DIAGNOSIS — G4733 Obstructive sleep apnea (adult) (pediatric): Secondary | ICD-10-CM | POA: Diagnosis present

## 2019-11-05 DIAGNOSIS — I10 Essential (primary) hypertension: Secondary | ICD-10-CM | POA: Diagnosis present

## 2019-11-05 DIAGNOSIS — I2694 Multiple subsegmental pulmonary emboli without acute cor pulmonale: Secondary | ICD-10-CM

## 2019-11-05 DIAGNOSIS — I2699 Other pulmonary embolism without acute cor pulmonale: Secondary | ICD-10-CM | POA: Diagnosis present

## 2019-11-05 DIAGNOSIS — I82442 Acute embolism and thrombosis of left tibial vein: Secondary | ICD-10-CM | POA: Diagnosis present

## 2019-11-05 DIAGNOSIS — E785 Hyperlipidemia, unspecified: Secondary | ICD-10-CM | POA: Diagnosis present

## 2019-11-05 DIAGNOSIS — I82452 Acute embolism and thrombosis of left peroneal vein: Secondary | ICD-10-CM | POA: Diagnosis present

## 2019-11-05 DIAGNOSIS — Z6836 Body mass index (BMI) 36.0-36.9, adult: Secondary | ICD-10-CM | POA: Diagnosis not present

## 2019-11-05 DIAGNOSIS — Z791 Long term (current) use of non-steroidal anti-inflammatories (NSAID): Secondary | ICD-10-CM | POA: Diagnosis not present

## 2019-11-05 DIAGNOSIS — E669 Obesity, unspecified: Secondary | ICD-10-CM | POA: Diagnosis present

## 2019-11-05 DIAGNOSIS — Z79899 Other long term (current) drug therapy: Secondary | ICD-10-CM | POA: Diagnosis not present

## 2019-11-05 LAB — ECHOCARDIOGRAM COMPLETE
Area-P 1/2: 3.72 cm2
Height: 71 in
S' Lateral: 3.2 cm
Weight: 4243.41 oz

## 2019-11-05 LAB — CBC
HCT: 48.7 % (ref 39.0–52.0)
Hemoglobin: 16.4 g/dL (ref 13.0–17.0)
MCH: 29.2 pg (ref 26.0–34.0)
MCHC: 33.7 g/dL (ref 30.0–36.0)
MCV: 86.7 fL (ref 80.0–100.0)
Platelets: 198 10*3/uL (ref 150–400)
RBC: 5.62 MIL/uL (ref 4.22–5.81)
RDW: 12.1 % (ref 11.5–15.5)
WBC: 8 10*3/uL (ref 4.0–10.5)
nRBC: 0 % (ref 0.0–0.2)

## 2019-11-05 LAB — CBC WITH DIFFERENTIAL/PLATELET
Abs Immature Granulocytes: 0.02 10*3/uL (ref 0.00–0.07)
Basophils Absolute: 0.1 10*3/uL (ref 0.0–0.1)
Basophils Relative: 1 %
Eosinophils Absolute: 0.2 10*3/uL (ref 0.0–0.5)
Eosinophils Relative: 3 %
HCT: 47.9 % (ref 39.0–52.0)
Hemoglobin: 16.4 g/dL (ref 13.0–17.0)
Immature Granulocytes: 0 %
Lymphocytes Relative: 41 %
Lymphs Abs: 3.2 10*3/uL (ref 0.7–4.0)
MCH: 29.8 pg (ref 26.0–34.0)
MCHC: 34.2 g/dL (ref 30.0–36.0)
MCV: 86.9 fL (ref 80.0–100.0)
Monocytes Absolute: 0.6 10*3/uL (ref 0.1–1.0)
Monocytes Relative: 7 %
Neutro Abs: 3.8 10*3/uL (ref 1.7–7.7)
Neutrophils Relative %: 48 %
Platelets: 214 10*3/uL (ref 150–400)
RBC: 5.51 MIL/uL (ref 4.22–5.81)
RDW: 12 % (ref 11.5–15.5)
WBC: 7.9 10*3/uL (ref 4.0–10.5)
nRBC: 0 % (ref 0.0–0.2)

## 2019-11-05 LAB — COMPREHENSIVE METABOLIC PANEL
ALT: 23 U/L (ref 0–44)
AST: 20 U/L (ref 15–41)
Albumin: 3.7 g/dL (ref 3.5–5.0)
Alkaline Phosphatase: 59 U/L (ref 38–126)
Anion gap: 10 (ref 5–15)
BUN: 8 mg/dL (ref 6–20)
CO2: 28 mmol/L (ref 22–32)
Calcium: 8.9 mg/dL (ref 8.9–10.3)
Chloride: 100 mmol/L (ref 98–111)
Creatinine, Ser: 1.09 mg/dL (ref 0.61–1.24)
GFR calc Af Amer: >60 mL/min (ref >60)
GFR calc non Af Amer: >60 mL/min (ref >60)
Glucose, Bld: 113 mg/dL — ABNORMAL HIGH (ref 70–99)
Potassium: 4.1 mmol/L (ref 3.5–5.1)
Sodium: 138 mmol/L (ref 135–145)
Total Bilirubin: 1.2 mg/dL (ref 0.3–1.2)
Total Protein: 7.4 g/dL (ref 6.5–8.1)

## 2019-11-05 LAB — HEPARIN LEVEL (UNFRACTIONATED)
Heparin Unfractionated: 0.19 IU/mL — ABNORMAL LOW (ref 0.30–0.70)
Heparin Unfractionated: 0.49 IU/mL (ref 0.30–0.70)
Heparin Unfractionated: 0.32 IU/mL (ref 0.30–0.70)
Heparin Unfractionated: 0.32 IU/mL (ref 0.30–0.70)

## 2019-11-05 LAB — TROPONIN I (HIGH SENSITIVITY): Troponin I (High Sensitivity): 6 ng/L (ref <18)

## 2019-11-05 LAB — HIV ANTIBODY (ROUTINE TESTING W REFLEX): HIV Screen 4th Generation wRfx: NONREACTIVE

## 2019-11-05 LAB — ANTITHROMBIN III: AntiThromb III Func: 80 % (ref 75–120)

## 2019-11-05 LAB — BRAIN NATRIURETIC PEPTIDE: B Natriuretic Peptide: 69.6 pg/mL (ref 0.0–100.0)

## 2019-11-05 MED ORDER — HEPARIN BOLUS VIA INFUSION
3000.0000 [IU] | Freq: Once | INTRAVENOUS | Status: AC
  Administered 2019-11-05: 3000 [IU] via INTRAVENOUS
  Filled 2019-11-05: qty 3000

## 2019-11-05 MED ORDER — ONDANSETRON HCL 4 MG PO TABS: 4.0000 mg | ORAL_TABLET | Freq: Four times a day (QID) | ORAL | Status: DC | PRN

## 2019-11-05 MED ORDER — PERFLUTREN LIPID MICROSPHERE
1.0000 mL | INTRAVENOUS | Status: AC | PRN
  Administered 2019-11-05: 2 mL via INTRAVENOUS
  Filled 2019-11-05: qty 10

## 2019-11-05 MED ORDER — ATORVASTATIN CALCIUM 10 MG PO TABS
20.0000 mg | ORAL_TABLET | Freq: Every day | ORAL | Status: DC
  Administered 2019-11-05 – 2019-11-07 (×3): 20 mg via ORAL
  Filled 2019-11-05 (×3): qty 2

## 2019-11-05 MED ORDER — ONDANSETRON HCL 4 MG/2ML IJ SOLN: 4.0000 mg | Freq: Four times a day (QID) | INTRAMUSCULAR | Status: DC | PRN

## 2019-11-05 NOTE — Progress Notes (Signed)
[place] for Heparin Indication: pulmonary embolus  Allergies  Allergen Reactions  . Penicillins     As a child    Patient Measurements: Height: 5\' 11"  (180.3 cm) Weight: 120.3 kg (265 lb 3.4 oz) IBW/kg (Calculated) : 75.3 Heparin Dosing Weight: 101.8kg  Vital Signs: Temp: 98.2 F (36.8 C) (08/31 0816) Temp Source: Oral (08/31 0811) BP: 121/76 (08/31 0811) Pulse Rate: 71 (08/31 0811)  Labs: Recent Labs    11/02/19 2244 11/03/19 0058 11/04/19 1751 11/04/19 1751 11/04/19 2002 11/05/19 0026 11/05/19 0250 11/05/19 0910  HGB 15.8   < > 16.0   < >  --  16.4 16.4  --   HCT 48.1   < > 47.9  --   --  48.7 47.9  --   PLT 209   < > 206  --   --  198 214  --   APTT  --   --  33  --   --   --   --   --   LABPROT  --   --  13.4  --   --   --   --   --   INR  --   --  1.1  --   --   --   --   --   HEPARINUNFRC  --   --   --   --   --  0.19*  --  0.49  CREATININE 1.10  --  0.98  --   --   --  1.09  --   TROPONINIHS 7   < > 5  --  5  --  6  --    < > = values in this interval not displayed.    Estimated Creatinine Clearance: 108.2 mL/min (by C-G formula based on SCr of 1.09 mg/dL).   Medical History: Past Medical History:  Diagnosis Date  . Hyperlipidemia   . HYPERLIPIDEMIA 04/01/2009  . Kidney stones   . Kidney stones   . Sleep apnea   . Sleep apnea     Medications:  Infusions:  . heparin 1,850 Units/hr (11/05/19 0349)    Assessment: 15 yom presented to the ED with SOB and bilateral PE. To start IV heparin. Baseline CBC was WNL as of 8/28 and he is not on anticoagulation PTA.   8/31 AM update:  Heparin level is therapeutic at 0.49    Goal of Therapy:  Heparin level 0.3-0.7 units/ml Monitor platelets by anticoagulation protocol: Yes   Plan:   Continue Heparin drip at 1850 units/hr Check a 6-8 hr confirmatory heparin level Daily heparin level and CBC Monitor for bleeding  Tyke Outman A. Levada Dy, PharmD, BCPS,  FNKF Clinical Pharmacist [place] Please utilize Amion for appropriate phone number to reach the unit pharmacist ([place])

## 2019-11-05 NOTE — Progress Notes (Signed)
ANTICOAGULATION CONSULT NOTE - Follow Up Consult  Pharmacy Consult for Heparin Indication: pulmonary embolus  Allergies  Allergen Reactions  . Penicillins     As a child    Patient Measurements: Height: 5\' 11"  (180.3 cm) Weight: 120.3 kg (265 lb 3.4 oz) IBW/kg (Calculated) : 75.3 Heparin Dosing Weight:  101.8 kg  Vital Signs: Temp: 98.6 F (37 C) (08/31 1627) Temp Source: Oral (08/31 1627) BP: 108/72 (08/31 1627) Pulse Rate: 72 (08/31 1627)  Labs: Recent Labs    11/02/19 2244 11/03/19 0058 11/04/19 1751 11/04/19 1751 11/04/19 2002 11/05/19 0026 11/05/19 0026 11/05/19 0250 11/05/19 0910 11/05/19 1612 11/05/19 1817  HGB 15.8   < > 16.0   < >  --  16.4  --  16.4  --   --   --   HCT 48.1   < > 47.9  --   --  48.7  --  47.9  --   --   --   PLT 209   < > 206  --   --  198  --  214  --   --   --   APTT  --   --  33  --   --   --   --   --   --   --   --   LABPROT  --   --  13.4  --   --   --   --   --   --   --   --   INR  --   --  1.1  --   --   --   --   --   --   --   --   HEPARINUNFRC  --   --   --   --   --  0.19*   < >  --  0.49 0.32 0.32  CREATININE 1.10  --  0.98  --   --   --   --  1.09  --   --   --   TROPONINIHS 7   < > 5  --  5  --   --  6  --   --   --    < > = values in this interval not displayed.    Estimated Creatinine Clearance: 108.2 mL/min (by C-G formula based on SCr of 1.09 mg/dL).  Assessment:  Anticoag: Heparin for PE - CBC on 8/28 was WNL, d-dimer 2.95, no AC PTA. Hep level 0.49> now undectectable?>>recheck 0.32 8/31 Echo with no evidence of RHS.   Goal of Therapy:  Heparin level 0.3-0.7 units/ml Monitor platelets by anticoagulation protocol: Yes   Plan:  Con't heparin 1850 units/hr Daily HL and CBC   Lise Pincus S. Alford Highland, PharmD, BCPS Clinical Staff Pharmacist [email or web address] Alford Highland, [employer] 11/05/2019,6:57 PM

## 2019-11-05 NOTE — Progress Notes (Signed)
[place] for Heparin Indication: pulmonary embolus  Allergies  Allergen Reactions  . Penicillins     As a child    Patient Measurements: Height: 5\' 11"  (180.3 cm) Weight: 120.3 kg (265 lb 3.4 oz) IBW/kg (Calculated) : 75.3 Heparin Dosing Weight: 101.8kg  Vital Signs: Temp: 98.5 F (36.9 C) (08/31 0000) Temp Source: Oral (08/31 0000) BP: 130/76 (08/31 0000) Pulse Rate: 80 (08/31 0052)  Labs: Recent Labs    11/02/19 2244 11/02/19 2244 11/03/19 0058 11/04/19 1751 11/04/19 2002 11/05/19 0026  HGB 15.8   < >  --  16.0  --  16.4  HCT 48.1  --   --  47.9  --  48.7  PLT 209  --   --  206  --  198  APTT  --   --   --  33  --   --   LABPROT  --   --   --  13.4  --   --   INR  --   --   --  1.1  --   --   HEPARINUNFRC  --   --   --   --   --  0.19*  CREATININE 1.10  --   --  0.98  --   --   TROPONINIHS 7   < > 5 5 5   --    < > = values in this interval not displayed.    Estimated Creatinine Clearance: 120.3 mL/min (by C-G formula based on SCr of 0.98 mg/dL).   Medical History: Past Medical History:  Diagnosis Date  . Hyperlipidemia   . HYPERLIPIDEMIA 04/01/2009  . Kidney stones   . Kidney stones   . Sleep apnea   . Sleep apnea     Medications:  Infusions:  . heparin 1,600 Units/hr (11/05/19 0000)    Assessment: 62 yom presented to the ED with SOB and bilateral PE. To start IV heparin. Baseline CBC was WNL as of 8/28 and he is not on anticoagulation PTA.   8/31 AM update:  Heparin level is sub-therapeutic  No issues per RN  Goal of Therapy:  Heparin level 0.3-0.7 units/ml Monitor platelets by anticoagulation protocol: Yes   Plan:  Heparin bolus 3000 units IV x 1 Inc Heparin drip to 1600 units/hr Check a 6-8 hr heparin level Daily heparin level and CBC\ Monitor for bleeding  Narda Bonds, PharmD, BCPS Clinical Pharmacist Phone: [phone]

## 2019-11-05 NOTE — Plan of Care (Signed)
  Problem: Clinical Measurements: Goal: Cardiovascular complication will be avoided Outcome: Progressing   Problem: Clinical Measurements: Goal: Respiratory complications will improve Outcome: Progressing   Problem: Clinical Measurements: Goal: Will remain free from infection Outcome: Progressing   Problem: Clinical Measurements: Goal: Ability to maintain clinical measurements within normal limits will improve Outcome: Progressing

## 2019-11-05 NOTE — Progress Notes (Signed)
Echocardiogram 2D Echocardiogram has been performed.  Oneal Deputy Nikkie Liming 11/05/2019, 9:28 AM

## 2019-11-05 NOTE — Progress Notes (Signed)
RT came to place patient on CPAP HS. Patient is already on CPAP and resting.

## 2019-11-05 NOTE — Progress Notes (Signed)
The patient is a 49 yr old man who presented to [facility] ED on 11/04/2019 due to 2 days of increasing shortness of breath. The patient thought that he had pneumonia. D Dimer at his PCP's office was elevated. CTA of the chest demonstrated a pulmonary embolus with a large clot burden.   [facility] were consulted to admit the patient for further evaluation and treatment earlier this morning. He is on a heparin drip. BL lower extremity doppler is pending. Due to large clot burden, an echocardiogram has been ordered. If there is no right heart strain, I will convert him to oral anticoagulants today with possible discharge tomorrow.   The patient did have an injury to his left ankle with bone fragments visible on x-ray. The patient denies that he was immobile for any amount of time. No long bone fracture. No long car or airplane trips. He has had a colonoscopy several years ago that was okay. He understands that as he is about to turn 49 he is due for colonoscopy again soon. I will check a PSA as I do not find one in his office visit records. He does not smoke, and he does not use exogenous hormones. His father was also diagnosed with clots at a similar age. I have ordered a hypercoaguable panel.   The patient is resting comfortably. No new complaints. He is awake, alert, and oriented x 3. No acute distress. Heart and lung exam is normal. Abdomen is soft, non-tender, non-distended, normal bowel sounds. Lower extremities are without cyanosis, clubbing, or edema. Homan is negative.

## 2019-11-05 NOTE — H&P (Signed)
History and Physical    TUCK DULWORTH FAO:[id number] DOB: 12-24-1970 DOA: 11/04/2019  PCP: Eulas Post, MD  Patient coming from: Home.  Chief Complaint: Shortness of breath.  HPI: Scott Mcconnell is a 49 y.o. male with history of hyperlipidemia and sleep apnea was experiencing shortness of breath for the last 2 days had come to the ER earlier but left before being seen because of the wait and his primary care physician who did a D-dimer and was positive and did a CT angiogram which showed pulmonary embolism and was referred to the ER.  Patient denies any recent travel or swelling in his legs.  He usually does aerobic exercise 3 times a week.  Had a left ankle sprain for which he wore an ankle brace.  No swelling was noted.  Has family history of pulmonary embolism in his dad multiple times in the 35s and 28s.  ED Course: In the ER patient is hemodynamically stable.  CT angiogram shows large clot burden but no strain pattern.  Does show signs of possible pulmonary hypertension.  EKG shows normal sinus rhythm high sensitive troponin was negative Covid test was negative.  Labs are largely unremarkable and patient was started on heparin and admitted for further observation.  Review of Systems: As per HPI, rest all negative.   Past Medical History:  Diagnosis Date  . Hyperlipidemia   . HYPERLIPIDEMIA 04/01/2009  . Kidney stones   . Kidney stones   . Sleep apnea   . Sleep apnea     Past Surgical History:  Procedure Laterality Date  . TONSILLECTOMY    . TONSILLECTOMY       reports that he has never smoked. He has never used smokeless tobacco. He reports that he does not drink alcohol and does not use drugs.  Allergies  Allergen Reactions  . Penicillins     As a child    Family History  Problem Relation Age of Onset  . Hyperlipidemia Mother   . Cancer Mother 66       Bilateral Breast Cancer  . Alcohol abuse Father   . Hyperlipidemia Father   . Hypertension Father   .  Heart disease Father 33       CAD  . Diabetes Father        grandfather  . Alcohol abuse Paternal Uncle   . Alcohol abuse Paternal Grandfather   . Cancer Other        prostate/grandfather, colon/grandmother  . Alzheimer's disease Other        grandmother  . Sudden death Other        grandfather  . Stroke Other        grandfather  . Hypothyroidism Sister     Prior to Admission medications   Medication Sig Start Date End Date Taking? Authorizing Provider  atorvastatin (LIPITOR) 20 MG tablet Take 1 tablet (20 mg total) by mouth daily. 12/28/18  Yes Burchette, Alinda Sierras, MD  meloxicam (MOBIC) 15 MG tablet Take by mouth. 10/05/19  Yes [provider]  sildenafil (VIAGRA) 100 MG tablet Take 0.5-1 tablets (50-100 mg total) by mouth daily as needed for erectile dysfunction. 07/01/19  Yes Burchette, Alinda Sierras, MD    Physical Exam: Constitutional: Moderately built and nourished. Vitals:   11/04/19 2150 11/04/19 2254 11/05/19 0000 11/05/19 0052  BP: 122/73  130/76   Pulse: 78  74 80  Resp:   (!) 21 19  Temp: 98.3 F (36.8 C)  98.5 F (  36.9 C)   TempSrc: Oral  Oral   SpO2: 94%   94%  Weight:  122.7 kg 120.3 kg   Height:   5\' 11"  (1.803 m)    Eyes: Anicteric no pallor. ENMT: No discharge from the ears eyes nose or mouth. Neck: No mass felt.  No neck rigidity. Respiratory: No rhonchi or crepitations. Cardiovascular: S1-S2 heard. Abdomen: Soft nontender bowel sounds present. Musculoskeletal: No edema. Skin: No rash. Neurologic: Alert awake oriented to time place and person.  Moves all extremities. Psychiatric: Appears normal.  Normal affect.   Labs on Admission: I have personally reviewed following labs and imaging studies  CBC: Recent Labs  Lab 11/02/19 2244 11/04/19 1751 11/05/19 0026  WBC 8.0 7.9 8.0  NEUTROABS  --  3.7  --   HGB 15.8 16.0 16.4  HCT 48.1 47.9 48.7  MCV 88.9 86.2 86.7  PLT 209 206 433   Basic Metabolic Panel: Recent Labs  Lab 11/02/19 2244  11/04/19 1751  NA 141 135  K 3.7 3.8  CL 103 99  CO2 28 29  GLUCOSE 123* 89  BUN 15 12  CREATININE 1.10 0.98  CALCIUM 9.2 8.6*   GFR: Estimated Creatinine Clearance: 120.3 mL/min (by C-G formula based on SCr of 0.98 mg/dL). Liver Function Tests: Recent Labs  Lab 11/04/19 1751  AST 21  ALT 21  ALKPHOS 59  BILITOT 0.8  PROT 7.7  ALBUMIN 3.9   No results for input(s): LIPASE, AMYLASE in the last 168 hours. No results for input(s): AMMONIA in the last 168 hours. Coagulation Profile: Recent Labs  Lab 11/04/19 1751  INR 1.1   Cardiac Enzymes: No results for input(s): CKTOTAL, CKMB, CKMBINDEX, TROPONINI in the last 168 hours. BNP (last 3 results) No results for input(s): PROBNP in the last 8760 hours. HbA1C: No results for input(s): HGBA1C in the last 72 hours. CBG: No results for input(s): GLUCAP in the last 168 hours. Lipid Profile: No results for input(s): CHOL, HDL, LDLCALC, TRIG, CHOLHDL, LDLDIRECT in the last 72 hours. Thyroid Function Tests: No results for input(s): TSH, T4TOTAL, FREET4, T3FREE, THYROIDAB in the last 72 hours. Anemia Panel: No results for input(s): VITAMINB12, FOLATE, FERRITIN, TIBC, IRON, RETICCTPCT in the last 72 hours. Urine analysis:    Component Value Date/Time   COLORURINE YELLOW 03/07/2014 [place] 03/07/2014 1355   LABSPEC 1.014 03/07/2014 1355   PHURINE 7.5 03/07/2014 1355   GLUCOSEU NEGATIVE 03/07/2014 1355   HGBUR MODERATE (A) 03/07/2014 1355   BILIRUBINUR Negative 12/01/2017 1555   KETONESUR NEGATIVE 03/07/2014 1355   PROTEINUR Positive (A) 12/01/2017 1555   PROTEINUR NEGATIVE 03/07/2014 1355   UROBILINOGEN 1.0 12/01/2017 1555   UROBILINOGEN 0.2 03/07/2014 1355   NITRITE Negative 12/01/2017 1555   NITRITE NEGATIVE 03/07/2014 1355   LEUKOCYTESUR Negative 12/01/2017 1555   Sepsis Labs: @LABRCNTIP (procalcitonin:4,lacticidven:4) ) Recent Results (from the past 240 hour(s))  SARS Coronavirus 2 by RT PCR  (hospital order, performed in [place] hospital lab) Nasopharyngeal Nasopharyngeal Swab     Status: None   Collection Time: 11/04/19  5:51 PM   Specimen: Nasopharyngeal Swab  Result Value Ref Range Status   SARS Coronavirus 2 NEGATIVE NEGATIVE Final    Comment: (NOTE) SARS-CoV-2 target nucleic acids are NOT DETECTED.  The SARS-CoV-2 RNA is generally detectable in upper and lower respiratory specimens during the acute phase of infection. The lowest concentration of SARS-CoV-2 viral copies this assay can detect is 250 copies / mL. A negative result does not  preclude SARS-CoV-2 infection and should not be used as the sole basis for treatment or other patient management decisions.  A negative result may occur with improper specimen collection / handling, submission of specimen other than nasopharyngeal swab, presence of viral mutation(s) within the areas targeted by this assay, and inadequate number of viral copies (<250 copies / mL). A negative result must be combined with clinical observations, patient history, and epidemiological information.  Fact Sheet for Patients:   StrictlyIdeas.no  Fact Sheet for Healthcare Providers: BankingDealers.co.za  This test is not yet approved or  cleared by the [place] FDA and has been authorized for detection and/or diagnosis of SARS-CoV-2 by FDA under an Emergency Use Authorization (EUA).  This EUA will remain in effect (meaning this test can be used) for the duration of the COVID-19 declaration under Section 564(b)(1) of the Act, 21 U.S.C. section 360bbb-3(b)(1), unless the authorization is terminated or revoked sooner.  Performed at [facility], [address] [place], [place] [address]      Radiological Exams on Admission: CT ANGIO CHEST PE W OR WO CONTRAST  Result Date: 11/04/2019 CLINICAL DATA:  PE suspected EXAM: CT ANGIOGRAPHY CHEST WITH CONTRAST TECHNIQUE:  Multidetector CT imaging of the chest was performed using the standard protocol during bolus administration of intravenous contrast. Multiplanar CT image reconstructions and MIPs were obtained to evaluate the vascular anatomy. CONTRAST:  126mL OMNIPAQUE IOHEXOL 350 MG/ML SOLN COMPARISON:  None. FINDINGS: Cardiovascular: Satisfactory opacification of the pulmonary arteries to the segmental level. Positive examination for pulmonary embolism with a relatively large burden of embolus present in the lobar and more distal vessels, right greater than left. The most proximal and most occlusive embolus is present in the right upper lobe pulmonary artery. The main pulmonary artery is enlarged, measuring up to 3.6 cm in caliber. The RV LV ratio is preserved, approximately 0.9. Normal heart size. No pericardial effusion. Mediastinum/Nodes: No enlarged mediastinal, hilar, or axillary lymph nodes. Thyroid gland, trachea, and esophagus demonstrate no significant findings. Lungs/Pleura: Scarring of the bilateral lung bases trace left pleural effusion. Upper Abdomen: No acute abnormality. Musculoskeletal: No chest wall abnormality. No acute or significant osseous findings. Review of the MIP images confirms the above findings. IMPRESSION: 1. Positive examination for pulmonary embolism with a relatively large burden of embolus present in the lobar and more distal vessels, right greater than left. 2. The main pulmonary artery is enlarged, measuring up to 3.6 cm in caliber, which can be seen in pulmonary hypertension. 3. The RV LV ratio is preserved, approximately 0.9. 4. Trace left pleural effusion, likely reactive. These results were called by telephone at the time of interpretation on 11/04/2019 at 4:55 pm to Dr. Carolann Littler , who verbally acknowledged these results. Electronically Signed   By: Eddie Candle M.D.   On: 11/04/2019 17:07    EKG: Independently reviewed.  Normal sinus rhythm.  Assessment/Plan Principal Problem:    Multiple subsegmental pulmonary emboli without acute cor pulmonale (HCC) Active Problems:   OSA (obstructive sleep apnea)   Acute pulmonary embolism ([place])    1. Acute pulmonary embolism with large clot burden but hemodynamically stable likely unprovoked will continue heparin for now if remains hemodynamically stable change to oral anticoagulants.  Check 2D echo Dopplers of the lower extremity check BNP and troponins. 2. Sleep apnea on CPAP at bedtime. 3. Hyperlipidemia on statins.   DVT prophylaxis: Heparin infusion. Code Status: Full code. Family Communication: Discussed with patient. Disposition Plan: Home. Consults called: None.  Admission status: Observation.   Rise Patience MD [facility] Pager [phone].  If 7PM-7AM, please contact night-coverage [email or web address] Password [email or web address]  11/05/2019, 1:51 AM

## 2019-11-05 NOTE — Progress Notes (Signed)
Pt 49 year old white male  With dx of bil  Pulmonary Embolism, admitted from [facility].via care link. Pt alert and oriented x 4 Moves all extremities  Skin intact except some dog  Scratches on bil legs per pt. Pt denied CP nor SOB at present. Cardiac monitor applied and CC MT called and Verification completed by 2 RN.. pt oriented to room and use of call light.  Safety , fall and PE education  Given and pt demonstrated  Understanding. Heparin drip  Infusion  At 16 ml/hr continues via pump. rate verification and sign off by 2 RN, Vs stable.. MD on call notified  upon pt.s arrival. RN will continue to monitor.pt.Marland Kitchen

## 2019-11-06 ENCOUNTER — Inpatient Hospital Stay (HOSPITAL_COMMUNITY): Payer: No Typology Code available for payment source

## 2019-11-06 DIAGNOSIS — I2699 Other pulmonary embolism without acute cor pulmonale: Secondary | ICD-10-CM

## 2019-11-06 LAB — CBC
HCT: 47.4 % (ref 39.0–52.0)
Hemoglobin: 16 g/dL (ref 13.0–17.0)
MCH: 29.4 pg (ref 26.0–34.0)
MCHC: 33.8 g/dL (ref 30.0–36.0)
MCV: 87.1 fL (ref 80.0–100.0)
Platelets: 216 10*3/uL (ref 150–400)
RBC: 5.44 MIL/uL (ref 4.22–5.81)
RDW: 12.1 % (ref 11.5–15.5)
WBC: 7.2 10*3/uL (ref 4.0–10.5)
nRBC: 0 % (ref 0.0–0.2)

## 2019-11-06 LAB — PROTEIN C ACTIVITY: Protein C Activity: 110 % (ref 73–180)

## 2019-11-06 LAB — COMPREHENSIVE METABOLIC PANEL
ALT: 23 U/L (ref 0–44)
AST: 20 U/L (ref 15–41)
Albumin: 3.4 g/dL — ABNORMAL LOW (ref 3.5–5.0)
Alkaline Phosphatase: 54 U/L (ref 38–126)
Anion gap: 11 (ref 5–15)
BUN: 10 mg/dL (ref 6–20)
CO2: 24 mmol/L (ref 22–32)
Calcium: 8.8 mg/dL — ABNORMAL LOW (ref 8.9–10.3)
Chloride: 102 mmol/L (ref 98–111)
Creatinine, Ser: 1.03 mg/dL (ref 0.61–1.24)
GFR calc Af Amer: >60 mL/min (ref >60)
GFR calc non Af Amer: >60 mL/min (ref >60)
Glucose, Bld: 104 mg/dL — ABNORMAL HIGH (ref 70–99)
Potassium: 4 mmol/L (ref 3.5–5.1)
Sodium: 137 mmol/L (ref 135–145)
Total Bilirubin: 1.1 mg/dL (ref 0.3–1.2)
Total Protein: 7.1 g/dL (ref 6.5–8.1)

## 2019-11-06 LAB — LUPUS ANTICOAGULANT PANEL
DRVVT: 42.6 s (ref 0.0–47.0)
PTT Lupus Anticoagulant: 40.8 s (ref 0.0–51.9)

## 2019-11-06 LAB — HEPARIN LEVEL (UNFRACTIONATED)
Heparin Unfractionated: 0.25 IU/mL — ABNORMAL LOW (ref 0.30–0.70)
Heparin Unfractionated: 0.39 IU/mL (ref 0.30–0.70)

## 2019-11-06 LAB — PROTEIN S ACTIVITY: Protein S Activity: 65 % (ref 63–140)

## 2019-11-06 LAB — PROTEIN C, TOTAL: Protein C, Total: 109 % (ref 60–150)

## 2019-11-06 LAB — HOMOCYSTEINE: Homocysteine: 7.8 umol/L (ref 0.0–14.5)

## 2019-11-06 LAB — PROTEIN S, TOTAL: Protein S Ag, Total: 92 % (ref 60–150)

## 2019-11-06 MED ORDER — ENOXAPARIN SODIUM 120 MG/0.8ML ~~LOC~~ SOLN
120.0000 mg | Freq: Two times a day (BID) | SUBCUTANEOUS | Status: DC
  Administered 2019-11-06 – 2019-11-07 (×2): 120 mg via SUBCUTANEOUS
  Filled 2019-11-06 (×3): qty 0.8

## 2019-11-06 MED ORDER — POLYETHYLENE GLYCOL 3350 17 G PO PACK: 17.0000 g | PACK | Freq: Every day | ORAL | Status: DC | PRN

## 2019-11-06 MED ORDER — SENNOSIDES-DOCUSATE SODIUM 8.6-50 MG PO TABS: 2.0000 | ORAL_TABLET | Freq: Every evening | ORAL | Status: DC | PRN

## 2019-11-06 MED ORDER — HEPARIN BOLUS VIA INFUSION
1500.0000 [IU] | Freq: Once | INTRAVENOUS | Status: AC
  Administered 2019-11-06: 1500 [IU] via INTRAVENOUS
  Filled 2019-11-06: qty 1500

## 2019-11-06 NOTE — TOC Benefit Eligibility Note (Signed)
Transition of Care [facility]) Benefit Eligibility Note    Patient Details  Name: Scott Mcconnell MRN: [id number] Date of Birth: 01/03/1971   Medication/Dose: Eliquis 10 mg bid for 30 days  Covered?: Yes  Tier: 3 Drug  Prescription Coverage Preferred Pharmacy: local pharmacy  Spoke with Person/Company/Phone Number:: Adrinne/  [employer]  [phone]  Co-Pay: $80.00  Prior Approval: No  Deductible: Met       Kerin Salen Phone Number: 11/06/2019, 11:24 AM

## 2019-11-06 NOTE — Progress Notes (Signed)
Lower Ext study completed.  ° °See CVProc for preliminary results.  ° °Shomari Matusik, RDMS, RVT ° °

## 2019-11-06 NOTE — Progress Notes (Addendum)
[place] for Heparin>>Enoxaparin Indication: pulmonary embolus  Allergies  Allergen Reactions  . Penicillins     As a child    Patient Measurements: Height: 5\' 11"  (180.3 cm) Weight: 120.3 kg (265 lb 3.4 oz) IBW/kg (Calculated) : 75.3 Heparin Dosing Weight: 101.8kg  Vital Signs: Temp: 98.4 F (36.9 C) (09/01 0328) Temp Source: Oral (09/01 0328) BP: 105/71 (09/01 0328) Pulse Rate: 60 (09/01 0328)  Labs: Recent Labs    11/04/19 1751 11/04/19 1751 11/04/19 2002 11/05/19 0026 11/05/19 0026 11/05/19 0250 11/05/19 0910 11/05/19 1612 11/05/19 1817 11/06/19 0459  HGB 16.0   < >  --  16.4   < > 16.4  --   --   --  16.0  HCT 47.9   < >  --  48.7  --  47.9  --   --   --  47.4  PLT 206   < >  --  198  --  214  --   --   --  216  APTT 33  --   --   --   --   --   --   --   --   --   LABPROT 13.4  --   --   --   --   --   --   --   --   --   INR 1.1  --   --   --   --   --   --   --   --   --   HEPARINUNFRC  --   --   --  0.19*  --   --    < > 0.32 0.32 0.25*  CREATININE 0.98  --   --   --   --  1.09  --   --   --  1.03  TROPONINIHS 5  --  5  --   --  6  --   --   --   --    < > = values in this interval not displayed.    Estimated Creatinine Clearance: 114.5 mL/min (by C-G formula based on SCr of 1.03 mg/dL).   Medical History: Past Medical History:  Diagnosis Date  . Hyperlipidemia   . HYPERLIPIDEMIA 04/01/2009  . Kidney stones   . Kidney stones   . Sleep apnea   . Sleep apnea     Medications:  Infusions:  . heparin 1,850 Units/hr (11/05/19 1737)    Assessment: 78 yom presented to the ED with SOB and bilateral PE. To start IV heparin. Baseline CBC was WNL as of 8/28 and he is not on anticoagulation PTA.   8/31 AM update:  Heparin level is subtherapeutic at 0.25    Goal of Therapy:  Heparin level 0.3-0.7 units/ml Monitor platelets by anticoagulation protocol: Yes   Plan:   Rebolus 1500 units x 1 Increase   Heparin drip to 2050 units/hr Check a 6-8 hr heparin level Daily heparin level and CBC Monitor for bleeding  Ceili Boshers A. Levada Dy, PharmD, BCPS, FNKF Clinical Pharmacist [place] Please utilize Amion for appropriate phone number to reach the unit pharmacist ([place])  Addendum:   Pharmacy asked to change patient's heparin drip to enoxaparin therapeutic dosing. Patient CrCl >100 and weight 120kg. Will d/c heparin drip at 1430 and start lovenox at 1530.   Plan:  D/C heparin drip Start enoxaparin 120mg  SQ q12h 1 hour later Monitor for renal function, and bleeding.   Ziquan Fidel  Karie Georges, PharmD, BCPS, FNKF Clinical Pharmacist [place] Please utilize Amion for appropriate phone number to reach the unit pharmacist ([place])

## 2019-11-06 NOTE — Progress Notes (Signed)
PROGRESS NOTE    Scott Mcconnell  UMP:[id number] DOB: 1970/10/21 DOA: 11/04/2019 PCP: Eulas Post, MD   Brief Narrative:  Patient is a 49 yr old man with chief complaint of sudden onset of shortness of breath which started on Saturday 11/03/19 after patient was mowing his lawn.  Patient rested the remainder of the day and at 10 pm he had back pain and left shoulder pain.  The patient decide to try to lie down for bed and then could not breath reporting extreme shortness of breath, and went to the ED at [facility], where he had CXR, EKG, and blood work don on 8/29-8/30.  Due to the prolonged wait time in the ED, the patient decided to go home and rest on Sunday 8/30,during the day on Sunday the patient continued to experience mild shortness of breath and called his PCP, who advised that he be seen in the office on Monday 11/05/19.  At his PCP, D-dimer levels were elevated at 2.5 and an outpatient CTA was ordered, which was positive for a pulmonary embolism.  The PCP referred him to immediate admission to the hospital.   Assessment & Plan:    1. Multiple Subsegmental Pulmonary Embolism with Acute Cor Pulmonale:   - Patient currently on heparin gtt, plan to bridge today to Lovenox sq, per pharmacy consult, INR subtherapeutic at 1.1  - Plan to transition patient to Eliquis at discharge -  Vascular lower extremity ultrasound (Doppler), 11/06/19 consistent with Left leg    DVT, likely source of PE.  - Patient to ambulate in room at tolerated.  - Continue to monitor shortness of breath, oxygen saturations, if dyspnea with mild exertion improved on 11/07/19 may be cleared for discharge.   2. Deep Vein Thrombosis (DVT):  -Vascular lower extremity ultrasound (Doppler), 11/06/19 consistent with Left leg    DVT. -Continue anticoagulation with transition from heparin gtt to Lovenox sq on 11/06/19 followed by Eliquis at discharge.   3. Obstructive Sleep Apnea:  - Chronic, stable.  Patient has home CPAP which he  uses at night.   4.  Obesity: - Counseled patient on dietary and lifestyle changes to manage weight including regular exercise. 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   DVT prophylaxis:  Heparin gtt Code Status: Full Family Communication: Wife present at bedside, updated.  Status is: Inpatient  Dispo: The patient is from: Home              Anticipated d/c is to: Home              Anticipated d/c date is: 1 day.              Patient currently not medically stable for discharge.   Body mass index is 36.99 kg/m.    Consultants:   Pharmacy for heparin dosing, heparin anticoagulant bridge.  Procedures:   Bilateral lower extremity ultrasound.  2D Echocardiogram.  Antimicrobials:   None   Subjective: Patient reports some shortness of breath with prolonged talking such as during physical exam today, but denies shortness of breath at rest or for brief periods of standing at bedside.  During ambulating in the hall, for ~240 ft, patient denies shortness of breath, during which oxygen saturations remained above 92 % on RA.   Review of Systems Otherwise negative except as per HPI, including: General: Denies fever, chills, night sweats or unintended weight loss. Resp: Lungs clear to auscultation, endorse shortness of breath with long sentence, but denies shortness  of breath at rest. Denies cough, wheezing. Cardiac: Denies chest pain, palpitations, orthopnea, paroxysmal nocturnal dyspnea. GI: Denies abdominal pain, nausea, vomiting, diarrhea or constipation GU: Denies dysuria, frequency, hesitancy or incontinence MS: Denies muscle aches, joint pain or swelling Neuro: Denies headache, neurologic deficits (focal weakness, numbness, tingling), abnormal gait Psych: Denies anxiety, depression, SI/HI/AVH Skin: Denies new rashes or lesions ID: Denies sick contacts, exotic exposures, travel  Examination:  General exam: Appears calm and comfortable  Respiratory  system: Clear to auscultation. Respiratory effort normal. Cardiovascular system: S1 & S2 heard, RRR. No JVD, murmurs, rubs, gallops or clicks. No pedal edema. Gastrointestinal system: Abdomen is nondistended, soft and nontender. No organomegaly or masses felt. Normal bowel sounds heard. Central nervous system: Alert and oriented. No focal neurological deficits. Extremities: Symmetric 5 x 5 power. Skin: No rashes, lesions or ulcers Psychiatry: Judgement and insight appear normal. Mood & affect appropriate.     Objective: Vitals:   11/05/19 2313 11/05/19 2328 11/06/19 0328 11/06/19 0758  BP:  116/76 105/71 130/81  Pulse: 63 66 60 85  Resp: 16 20 19 17   Temp:  97.8 F (36.6 C) 98.4 F (36.9 C) 98.3 F (36.8 C)  TempSrc:  Oral Oral Oral  SpO2: 95% 97% 96% 94%  Weight:      Height:        Intake/Output Summary (Last 24 hours) at 11/06/2019 1115 Last data filed at 11/06/2019 0759 Gross per 24 hour  Intake 240 ml  Output 1225 ml  Net -985 ml   Filed Weights   11/04/19 1743 11/04/19 2254 11/05/19 0000  Weight: 122.5 kg 122.7 kg 120.3 kg     Data Reviewed:   CBC: Recent Labs  Lab 11/02/19 2244 11/04/19 1751 11/05/19 0026 11/05/19 0250 11/06/19 0459  WBC 8.0 7.9 8.0 7.9 7.2  NEUTROABS  --  3.7  --  3.8  --   HGB 15.8 16.0 16.4 16.4 16.0  HCT 48.1 47.9 48.7 47.9 47.4  MCV 88.9 86.2 86.7 86.9 87.1  PLT 209 206 198 214 545   Basic Metabolic Panel: Recent Labs  Lab 11/02/19 2244 11/04/19 1751 11/05/19 0250 11/06/19 0459  NA 141 135 138 137  K 3.7 3.8 4.1 4.0  CL 103 99 100 102  CO2 28 29 28 24   GLUCOSE 123* 89 113* 104*  BUN 15 12 8 10   CREATININE 1.10 0.98 1.09 1.03  CALCIUM 9.2 8.6* 8.9 8.8*   GFR: Estimated Creatinine Clearance: 114.5 mL/min (by C-G formula based on SCr of 1.03 mg/dL). Liver Function Tests: Recent Labs  Lab 11/04/19 1751 11/05/19 0250 11/06/19 0459  AST 21 20 20   ALT 21 23 23   ALKPHOS 59 59 54  BILITOT 0.8 1.2 1.1  PROT 7.7 7.4 7.1   ALBUMIN 3.9 3.7 3.4*   No results for input(s): LIPASE, AMYLASE in the last 168 hours. No results for input(s): AMMONIA in the last 168 hours. Coagulation Profile: Recent Labs  Lab 11/04/19 1751  INR 1.1   Cardiac Enzymes: No results for input(s): CKTOTAL, CKMB, CKMBINDEX, TROPONINI in the last 168 hours. BNP (last 3 results) No results for input(s): PROBNP in the last 8760 hours. HbA1C: No results for input(s): HGBA1C in the last 72 hours. CBG: No results for input(s): GLUCAP in the last 168 hours. Lipid Profile: No results for input(s): CHOL, HDL, LDLCALC, TRIG, CHOLHDL, LDLDIRECT in the last 72 hours. Thyroid Function Tests: No results for input(s): TSH, T4TOTAL, FREET4, T3FREE, THYROIDAB in the last 72 hours. Anemia Panel:  No results for input(s): VITAMINB12, FOLATE, FERRITIN, TIBC, IRON, RETICCTPCT in the last 72 hours. Sepsis Labs: No results for input(s): PROCALCITON, LATICACIDVEN in the last 168 hours.  Recent Results (from the past 240 hour(s))  SARS Coronavirus 2 by RT PCR (hospital order, performed in [facility] hospital lab) Nasopharyngeal Nasopharyngeal Swab     Status: None   Collection Time: 11/04/19  5:51 PM   Specimen: Nasopharyngeal Swab  Result Value Ref Range Status   SARS Coronavirus 2 NEGATIVE NEGATIVE Final    Comment: (NOTE) SARS-CoV-2 target nucleic acids are NOT DETECTED.  The SARS-CoV-2 RNA is generally detectable in upper and lower respiratory specimens during the acute phase of infection. The lowest concentration of SARS-CoV-2 viral copies this assay can detect is 250 copies / mL. A negative result does not preclude SARS-CoV-2 infection and should not be used as the sole basis for treatment or other patient management decisions.  A negative result may occur with improper specimen collection / handling, submission of specimen other than nasopharyngeal swab, presence of viral mutation(s) within the areas targeted by this assay, and inadequate  number of viral copies (<250 copies / mL). A negative result must be combined with clinical observations, patient history, and epidemiological information.  Fact Sheet for Patients:   StrictlyIdeas.no  Fact Sheet for Healthcare Providers: BankingDealers.co.za  This test is not yet approved or  cleared by the [place] FDA and has been authorized for detection and/or diagnosis of SARS-CoV-2 by FDA under an Emergency Use Authorization (EUA).  This EUA will remain in effect (meaning this test can be used) for the duration of the COVID-19 declaration under Section 564(b)(1) of the Act, 21 U.S.C. section 360bbb-3(b)(1), unless the authorization is terminated or revoked sooner.  Performed at [facility], [address] [place], [place] [address]          Radiology Studies: CT ANGIO CHEST PE W OR WO CONTRAST  Result Date: 11/04/2019 CLINICAL DATA:  PE suspected EXAM: CT ANGIOGRAPHY CHEST WITH CONTRAST TECHNIQUE: Multidetector CT imaging of the chest was performed using the standard protocol during bolus administration of intravenous contrast. Multiplanar CT image reconstructions and MIPs were obtained to evaluate the vascular anatomy. CONTRAST:  186mL OMNIPAQUE IOHEXOL 350 MG/ML SOLN COMPARISON:  None. FINDINGS: Cardiovascular: Satisfactory opacification of the pulmonary arteries to the segmental level. Positive examination for pulmonary embolism with a relatively large burden of embolus present in the lobar and more distal vessels, right greater than left. The most proximal and most occlusive embolus is present in the right upper lobe pulmonary artery. The main pulmonary artery is enlarged, measuring up to 3.6 cm in caliber. The RV LV ratio is preserved, approximately 0.9. Normal heart size. No pericardial effusion. Mediastinum/Nodes: No enlarged mediastinal, hilar, or axillary lymph nodes. Thyroid gland, trachea, and esophagus  demonstrate no significant findings. Lungs/Pleura: Scarring of the bilateral lung bases trace left pleural effusion. Upper Abdomen: No acute abnormality. Musculoskeletal: No chest wall abnormality. No acute or significant osseous findings. Review of the MIP images confirms the above findings. IMPRESSION: 1. Positive examination for pulmonary embolism with a relatively large burden of embolus present in the lobar and more distal vessels, right greater than left. 2. The main pulmonary artery is enlarged, measuring up to 3.6 cm in caliber, which can be seen in pulmonary hypertension. 3. The RV LV ratio is preserved, approximately 0.9. 4. Trace left pleural effusion, likely reactive. These results were called by telephone at the time of interpretation on  11/04/2019 at 4:55 pm to Dr. Carolann Littler , who verbally acknowledged these results. Electronically Signed   By: Eddie Candle M.D.   On: 11/04/2019 17:07   ECHOCARDIOGRAM COMPLETE  Result Date: 11/05/2019    ECHOCARDIOGRAM REPORT   Patient Name:   KUZEY OGATA Date of Exam: 11/05/2019 Medical Rec #:  [id number]      Height:       71.0 in Accession #:    [id number]     Weight:       265.2 lb Date of Birth:  [birth year]     BSA:          2.377 m Patient Age:    66 years       BP:           121/76 mmHg Patient Gender: M              HR:           76 bpm. Exam Location:  Inpatient Procedure: 2D Echo, Color Doppler, Cardiac Doppler and Intracardiac            Opacification Agent Indications:    I26.02 Pulmonary embolus  History:        Patient has no prior history of Echocardiogram examinations.                 Risk Factors:Diabetes and Sleep Apnea.  Sonographer:    Raquel Sarna Senior RDCS Referring Phys: [place]  1. Left ventricular ejection fraction, by estimation, is 55 to 60%. The left ventricle has normal function. The left ventricle has no regional wall motion abnormalities. There is mild left ventricular hypertrophy. Left ventricular  diastolic parameters are consistent with Grade I diastolic dysfunction (impaired relaxation).  2. Right ventricular systolic function is normal. The right ventricular size is normal. Tricuspid regurgitation signal is inadequate for assessing PA pressure.  3. Right atrial size was mildly dilated.  4. The mitral valve is normal in structure. No evidence of mitral valve regurgitation. No evidence of mitral stenosis.  5. The aortic valve is tricuspid. Aortic valve regurgitation is not visualized. No aortic stenosis is present.  6. Aortic dilatation noted. Aneurysm of the aortic root. There is mild dilatation of the aortic root measuring 38 mm.  7. The inferior vena cava is normal in size with greater than 50% respiratory variability, suggesting right atrial pressure of 3 mmHg. FINDINGS  Left Ventricle: Left ventricular ejection fraction, by estimation, is 55 to 60%. The left ventricle has normal function. The left ventricle has no regional wall motion abnormalities. Definity contrast agent was given IV to delineate the left ventricular  endocardial borders. The left ventricular internal cavity size was normal in size. There is mild left ventricular hypertrophy. Left ventricular diastolic parameters are consistent with Grade I diastolic dysfunction (impaired relaxation). Right Ventricle: The right ventricular size is normal. No increase in right ventricular wall thickness. Right ventricular systolic function is normal. Tricuspid regurgitation signal is inadequate for assessing PA pressure. Left Atrium: Left atrial size was normal in size. Right Atrium: Right atrial size was mildly dilated. Pericardium: There is no evidence of pericardial effusion. Mitral Valve: The mitral valve is normal in structure. No evidence of mitral valve regurgitation. No evidence of mitral valve stenosis. Tricuspid Valve: The tricuspid valve is normal in structure. Tricuspid valve regurgitation is not demonstrated. Aortic Valve: The aortic valve  is tricuspid. Aortic valve regurgitation is not visualized. No aortic stenosis is present. Pulmonic Valve: The pulmonic  valve was normal in structure. Pulmonic valve regurgitation is not visualized. Aorta: Aortic dilatation noted. There is mild dilatation of the aortic root measuring 38 mm. There is an aneurysm involving the aortic root. Venous: The inferior vena cava is normal in size with greater than 50% respiratory variability, suggesting right atrial pressure of 3 mmHg. IAS/Shunts: No atrial level shunt detected by color flow Doppler.  LEFT VENTRICLE PLAX 2D LVIDd:         4.90 cm  Diastology LVIDs:         3.20 cm  LV e' lateral:   12.60 cm/s LV PW:         1.10 cm  LV E/e' lateral: 4.5 LV IVS:        1.10 cm  LV e' medial:    6.96 cm/s LVOT diam:     2.40 cm  LV E/e' medial:  8.2 LV SV:         83 LV SV Index:   35 LVOT Area:     4.52 cm  RIGHT VENTRICLE RV S prime:     13.30 cm/s TAPSE (M-mode): 2.8 cm LEFT ATRIUM             Index       RIGHT ATRIUM           Index LA diam:        3.10 cm 1.30 cm/m  RA Area:     22.10 cm LA Vol (A2C):   50.6 ml 21.29 ml/m RA Volume:   71.30 ml  29.99 ml/m LA Vol (A4C):   33.7 ml 14.18 ml/m LA Biplane Vol: 43.2 ml 18.17 ml/m  AORTIC VALVE LVOT Vmax:   89.40 cm/s LVOT Vmean:  67.800 cm/s LVOT VTI:    0.183 m  AORTA Ao Root diam: 3.80 cm Ao Asc diam:  3.40 cm MITRAL VALVE MV Area (PHT): 3.72 cm    SHUNTS MV Decel Time: 204 msec    Systemic VTI:  0.18 m MV E velocity: 57.00 cm/s  Systemic Diam: 2.40 cm MV A velocity: 74.60 cm/s MV E/A ratio:  0.76 Loralie Champagne MD Electronically signed by Loralie Champagne MD Signature Date/Time: 11/05/2019/3:10:45 PM    Final    VAS [place] LOWER EXTREMITY VENOUS (DVT)  Result Date: 11/06/2019  Lower Venous DVTStudy Indications: Pulmonary embolism.  Risk Factors: Confirmed PE. Performing Technologist: Griffin Basil RCT RDMS  Examination Guidelines: A complete evaluation includes B-mode imaging, spectral Doppler, color Doppler, and power  Doppler as needed of all accessible portions of each vessel. Bilateral testing is considered an integral part of a complete examination. Limited examinations for reoccurring indications may be performed as noted. The reflux portion of the exam is performed with the patient in reverse Trendelenburg.  +---------+---------------+---------+-----------+----------+--------------+ RIGHT    CompressibilityPhasicitySpontaneityPropertiesThrombus Aging +---------+---------------+---------+-----------+----------+--------------+ CFV      Full           Yes      Yes                                 +---------+---------------+---------+-----------+----------+--------------+ SFJ      Full                                                        +---------+---------------+---------+-----------+----------+--------------+  FV Prox  Full                                                        +---------+---------------+---------+-----------+----------+--------------+ FV DistalFull                                                        +---------+---------------+---------+-----------+----------+--------------+ PFV      Full                                                        +---------+---------------+---------+-----------+----------+--------------+ POP      Full           Yes      Yes                                 +---------+---------------+---------+-----------+----------+--------------+ PTV      Full                                                        +---------+---------------+---------+-----------+----------+--------------+ PERO     Full                                                        +---------+---------------+---------+-----------+----------+--------------+   +---------+---------------+---------+-----------+-----------------+------------+ LEFT     CompressibilityPhasicitySpontaneityProperties       Thrombus                                                                   Aging        +---------+---------------+---------+-----------+-----------------+------------+ CFV      Full           Yes      Yes                                      +---------+---------------+---------+-----------+-----------------+------------+ SFJ      Full                                                             +---------+---------------+---------+-----------+-----------------+------------+ FV Prox  Full                                                             +---------+---------------+---------+-----------+-----------------+------------+  FV Mid   Full                                                             +---------+---------------+---------+-----------+-----------------+------------+ FV DistalFull                                                             +---------+---------------+---------+-----------+-----------------+------------+ PFV      Full                                                             +---------+---------------+---------+-----------+-----------------+------------+ POP      Full           Yes      Yes                                      +---------+---------------+---------+-----------+-----------------+------------+ PTV      Partial                            spongy           Acute                                                    w/compression                 +---------+---------------+---------+-----------+-----------------+------------+ PERO     None                               spongy           Acute                                                    w/compression                 +---------+---------------+---------+-----------+-----------------+------------+     Summary: RIGHT: - There is no evidence of deep vein thrombosis in the lower extremity.  - No cystic structure found in the popliteal fossa.  LEFT: - Findings consistent with acute deep vein thrombosis  involving the left posterior tibial veins, and left peroneal veins. - No cystic structure found in the popliteal fossa. - Complete PERO involvement , partial PTV involvement ( dist portion )  *See table(s) above for measurements and observations.    Preliminary         Scheduled Meds: . atorvastatin  20 mg Oral Daily   Continuous Infusions: . heparin 2,050 Units/hr (11/06/19  0820)     LOS: 1 day   Time spent= 20 mins   Dede Query, RN, NP Student   If 7PM-7AM, please contact night-coverage  11/06/2019, 11:15 AM

## 2019-11-07 ENCOUNTER — Telehealth: Payer: Self-pay | Admitting: Family Medicine

## 2019-11-07 LAB — BASIC METABOLIC PANEL
Anion gap: 12 (ref 5–15)
BUN: 14 mg/dL (ref 6–20)
CO2: 24 mmol/L (ref 22–32)
Calcium: 9.1 mg/dL (ref 8.9–10.3)
Chloride: 100 mmol/L (ref 98–111)
Creatinine, Ser: 0.97 mg/dL (ref 0.61–1.24)
GFR calc Af Amer: >60 mL/min (ref >60)
GFR calc non Af Amer: >60 mL/min (ref >60)
Glucose, Bld: 95 mg/dL (ref 70–99)
Potassium: 4.7 mmol/L (ref 3.5–5.1)
Sodium: 136 mmol/L (ref 135–145)

## 2019-11-07 LAB — CARDIOLIPIN ANTIBODIES, IGG, IGM, IGA
Anticardiolipin IgA: <9 APL U/mL (ref 0–11)
Anticardiolipin IgG: <9 GPL U/mL (ref 0–14)
Anticardiolipin IgM: 13 MPL U/mL — ABNORMAL HIGH (ref 0–12)

## 2019-11-07 LAB — CBC
HCT: 48.3 % (ref 39.0–52.0)
Hemoglobin: 16.2 g/dL (ref 13.0–17.0)
MCH: 29.2 pg (ref 26.0–34.0)
MCHC: 33.5 g/dL (ref 30.0–36.0)
MCV: 87 fL (ref 80.0–100.0)
Platelets: 224 10*3/uL (ref 150–400)
RBC: 5.55 MIL/uL (ref 4.22–5.81)
RDW: 12 % (ref 11.5–15.5)
WBC: 7.3 10*3/uL (ref 4.0–10.5)
nRBC: 0 % (ref 0.0–0.2)

## 2019-11-07 LAB — FACTOR 5 LEIDEN

## 2019-11-07 LAB — MAGNESIUM: Magnesium: 2.1 mg/dL (ref 1.7–2.4)

## 2019-11-07 LAB — BETA-2-GLYCOPROTEIN I ABS, IGG/M/A
Beta-2 Glyco I IgG: <9 GPI IgG units (ref 0–20)
Beta-2-Glycoprotein I IgA: <9 GPI IgA units (ref 0–25)
Beta-2-Glycoprotein I IgM: <9 GPI IgM units (ref 0–32)

## 2019-11-07 MED ORDER — APIXABAN 5 MG PO TABS: 5.0000 mg | ORAL_TABLET | Freq: Two times a day (BID) | ORAL | 0 refills | Status: DC

## 2019-11-07 MED ORDER — APIXABAN 5 MG PO TABS
5.0000 mg | ORAL_TABLET | Freq: Two times a day (BID) | ORAL | 0 refills | Status: DC
Start: 2019-11-07 — End: 2019-11-07

## 2019-11-07 MED ORDER — APIXABAN 5 MG PO TABS: ORAL_TABLET | ORAL | 0 refills | Status: DC

## 2019-11-07 MED FILL — ELIQUIS 5 MG TABLET: 5 | 30 days supply | Qty: 74 | Fill #0

## 2019-11-07 NOTE — Telephone Encounter (Signed)
Transition Care Management Unsuccessful Follow-up Telephone Call  Date of discharge and from where:  11/07/2019 from [facility]  Attempts:  1st Attempt  Reason for unsuccessful TCM follow-up call:  Unable to leave message

## 2019-11-07 NOTE — TOC Transition Note (Signed)
Transition of Care [facility]) - CM/SW Discharge Note   Patient Details  Name: Scott Mcconnell MRN: [id number] Date of Birth: 09-15-1970  Transition of Care [facility]) CM/SW Contact:  Pollie Friar, RN Phone Number: 11/07/2019, 8:53 AM   Clinical Narrative:    Pt discharging home with self care. Pt has supervision at home per his spouse.  CM provided him the 30 day free card and $10 co pay cards for Eliquis.  Pt has transportation home.    Final next level of care: Home/Self Care Barriers to Discharge: No Barriers Identified   Patient Goals and CMS Choice        Discharge Placement                       Discharge Plan and Services                                     Social Determinants of Health (SDOH) Interventions     Readmission Risk Interventions No flowsheet data found.

## 2019-11-07 NOTE — Progress Notes (Signed)
Pt has been discharged via wheelchair. All IV and tele disconnected. Pt denies pain. All belongings sent with pt. AVS documentation given and reviewed. Pt sent in good spirits

## 2019-11-07 NOTE — Discharge Summary (Signed)
Physician Discharge Summary  HANSEN CARINO JAS:[id number] DOB: [birth year] DOA: 11/04/2019  PCP: Eulas Post, MD  Admit date: 11/04/2019 Discharge date: 11/07/2019  Admitted From: Disposition:    Recommendations for Outpatient Follow-up:  1. Follow up with PCP in 1-2 weeks 2. Please obtain BMP/CBC in one week your next doctors visit.  3.   Eliquis 5 mg BID 4.   Lipitor 20 mg daily 5.   Use NSAID's on limited basis  Home Health: None Equipment/Devices: None Discharge Condition: Stable CODE STATUS: Full Diet recommendation: Heart Healthy   Brief/Interim Summary:  Patient is a 49-year with history of HLD, obstructive sleep apnea who presented  with dyspnea on exertion and was evaluated at his PCP.  At his PCP, D-dimer levels were elevated at 2.5 and an outpatient CTA was ordered, which was positive for a pulmonary embolism without any strain. The PCP referred him to immediate admission to the hospital.. Echocardiogram showed EF of 41%, grade 1 diastolic dysfunction, slightly elevated troponins, but unremarkable EKG.  Anticoagulation with heparin gtt, transitioned to Lovenox and discharged on Eliquis.  Body mass index is 36.99 kg/m.     Discharge Diagnoses:  Principal Problem:   Multiple subsegmental pulmonary emboli without acute cor pulmonale (HCC) Active Problems:   OSA (obstructive sleep apnea)   Acute pulmonary embolism (HCC)   Pulmonary embolus (HCC)  Acute pulmonary embolism with large clot burden without cor pulmonale also has left lower extremity DVT- -Pt transitioned to Lovenox, will discharge home on Eliquis. PE secondary to sedentary lifestyle.  -F/U with PCP as outpatient with anticoagulation for minimum 1 year.   Obstructive sleep apnea-on CPAP  Hyperlipidemia-continue home meds   Consultations:  None  Subjective: Patient reports feeling much better this morning, denies dyspnea at rest or exertion. Patient reports ambulating on unit four  times yesterday without feeling short of breath.  Walked patient this am, oxygen saturations > 95% on RA with no dyspnea reported.   Discharge Exam: Vitals:   11/07/19 0342 11/07/19 0731  BP: (!) 93/45 114/75  Pulse: 61 76  Resp: 19 14  Temp: 98 F (36.7 C) 97.6 F (36.4 C)  SpO2: 95% 95%   Vitals:   11/06/19 1927 11/06/19 2337 11/07/19 0342 11/07/19 0731  BP: 132/79 103/71 (!) 93/45 114/75  Pulse: 80 67 61 76  Resp: 15 18 19 14   Temp: 98.5 F (36.9 C) 98.4 F (36.9 C) 98 F (36.7 C) 97.6 F (36.4 C)  TempSrc: Oral Oral Oral Oral  SpO2: 95% 94% 95% 95%  Weight:      Height:        General: Pt is alert, awake, not in acute distress Cardiovascular: RRR, S1/S2 +, no rubs, no gallops Respiratory: CTA bilaterally, no wheezing, no rhonchi Abdominal: Soft, NT, ND, bowel sounds + Extremities: no edema, no cyanosis  Discharge Instructions   Allergies as of 11/07/2019      Reactions   Penicillins    As a child      Medication List    STOP taking these medications   ibuprofen 200 MG tablet Commonly known as: ADVIL   meloxicam 15 MG tablet Commonly known as: MOBIC     TAKE these medications   apixaban 5 MG Tabs tablet Commonly known as: Eliquis Take 1-2 tablets (5-10 mg total) by mouth 2 (two) times daily. Take 10mg  by mouth twice daily for 7 days from 9/2 - 11/13/2019. Then on 11/14/2019 in the evening, decrease the dose to 5mg  by mouth  twice daily   atorvastatin 20 MG tablet Commonly known as: LIPITOR Take 1 tablet (20 mg total) by mouth daily.   sildenafil 100 MG tablet Commonly known as: Viagra Take 0.5-1 tablets (50-100 mg total) by mouth daily as needed for erectile dysfunction.       Follow-up Information    Burchette, Alinda Sierras, MD. Schedule an appointment as soon as possible for a visit in 2 week(s).   Specialty: Family Medicine Contact information: [address] [place] [address] [phone]              Allergies  Allergen  Reactions  . Penicillins     As a child    You were cared for by a hospitalist during your hospital stay. If you have any questions about your discharge medications or the care you received while you were in the hospital after you are discharged, you can call the unit and asked to speak with the hospitalist on call if the hospitalist that took care of you is not available. Once you are discharged, your primary care physician will handle any further medical issues. Please note that no refills for any discharge medications will be authorized once you are discharged, as it is imperative that you return to your primary care physician (or establish a relationship with a primary care physician if you do not have one) for your aftercare needs so that they can reassess your need for medications and monitor your lab values.   Procedures/Studies: DG Chest 2 View  Result Date: 11/02/2019 CLINICAL DATA:  Chest pain. EXAM: CHEST - 2 VIEW COMPARISON:  None. FINDINGS: Mild atelectasis and/or infiltrate is seen within the left lung base. There is no evidence of a pleural effusion or pneumothorax. The heart size and mediastinal contours are within normal limits. The visualized skeletal structures are unremarkable. IMPRESSION: Mild left basilar atelectasis and/or infiltrate. Electronically Signed   By: Virgina Norfolk M.D.   On: 11/02/2019 23:00   CT ANGIO CHEST PE W OR WO CONTRAST  Result Date: 11/04/2019 CLINICAL DATA:  PE suspected EXAM: CT ANGIOGRAPHY CHEST WITH CONTRAST TECHNIQUE: Multidetector CT imaging of the chest was performed using the standard protocol during bolus administration of intravenous contrast. Multiplanar CT image reconstructions and MIPs were obtained to evaluate the vascular anatomy. CONTRAST:  135mL OMNIPAQUE IOHEXOL 350 MG/ML SOLN COMPARISON:  None. FINDINGS: Cardiovascular: Satisfactory opacification of the pulmonary arteries to the segmental level. Positive examination for pulmonary  embolism with a relatively large burden of embolus present in the lobar and more distal vessels, right greater than left. The most proximal and most occlusive embolus is present in the right upper lobe pulmonary artery. The main pulmonary artery is enlarged, measuring up to 3.6 cm in caliber. The RV LV ratio is preserved, approximately 0.9. Normal heart size. No pericardial effusion. Mediastinum/Nodes: No enlarged mediastinal, hilar, or axillary lymph nodes. Thyroid gland, trachea, and esophagus demonstrate no significant findings. Lungs/Pleura: Scarring of the bilateral lung bases trace left pleural effusion. Upper Abdomen: No acute abnormality. Musculoskeletal: No chest wall abnormality. No acute or significant osseous findings. Review of the MIP images confirms the above findings. IMPRESSION: 1. Positive examination for pulmonary embolism with a relatively large burden of embolus present in the lobar and more distal vessels, right greater than left. 2. The main pulmonary artery is enlarged, measuring up to 3.6 cm in caliber, which can be seen in pulmonary hypertension. 3. The RV LV ratio is preserved, approximately 0.9. 4. Trace left pleural effusion,  likely reactive. These results were called by telephone at the time of interpretation on 11/04/2019 at 4:55 pm to Dr. Carolann Littler , who verbally acknowledged these results. Electronically Signed   By: Eddie Candle M.D.   On: 11/04/2019 17:07   ECHOCARDIOGRAM COMPLETE  Result Date: 11/05/2019    ECHOCARDIOGRAM REPORT   Patient Name:   ANTIONE OBAR Date of Exam: 11/05/2019 Medical Rec #:  [id number]      Height:       71.0 in Accession #:    [id number]     Weight:       265.2 lb Date of Birth:  1970-10-01     BSA:          2.377 m Patient Age:    49 years       BP:           121/76 mmHg Patient Gender: M              HR:           76 bpm. Exam Location:  Inpatient Procedure: 2D Echo, Color Doppler, Cardiac Doppler and Intracardiac            Opacification  Agent Indications:    I26.02 Pulmonary embolus  History:        Patient has no prior history of Echocardiogram examinations.                 Risk Factors:Diabetes and Sleep Apnea.  Sonographer:    Raquel Sarna Senior RDCS Referring Phys: [place]  1. Left ventricular ejection fraction, by estimation, is 55 to 60%. The left ventricle has normal function. The left ventricle has no regional wall motion abnormalities. There is mild left ventricular hypertrophy. Left ventricular diastolic parameters are consistent with Grade I diastolic dysfunction (impaired relaxation).  2. Right ventricular systolic function is normal. The right ventricular size is normal. Tricuspid regurgitation signal is inadequate for assessing PA pressure.  3. Right atrial size was mildly dilated.  4. The mitral valve is normal in structure. No evidence of mitral valve regurgitation. No evidence of mitral stenosis.  5. The aortic valve is tricuspid. Aortic valve regurgitation is not visualized. No aortic stenosis is present.  6. Aortic dilatation noted. Aneurysm of the aortic root. There is mild dilatation of the aortic root measuring 38 mm.  7. The inferior vena cava is normal in size with greater than 50% respiratory variability, suggesting right atrial pressure of 3 mmHg. FINDINGS  Left Ventricle: Left ventricular ejection fraction, by estimation, is 55 to 60%. The left ventricle has normal function. The left ventricle has no regional wall motion abnormalities. Definity contrast agent was given IV to delineate the left ventricular  endocardial borders. The left ventricular internal cavity size was normal in size. There is mild left ventricular hypertrophy. Left ventricular diastolic parameters are consistent with Grade I diastolic dysfunction (impaired relaxation). Right Ventricle: The right ventricular size is normal. No increase in right ventricular wall thickness. Right ventricular systolic function is normal. Tricuspid  regurgitation signal is inadequate for assessing PA pressure. Left Atrium: Left atrial size was normal in size. Right Atrium: Right atrial size was mildly dilated. Pericardium: There is no evidence of pericardial effusion. Mitral Valve: The mitral valve is normal in structure. No evidence of mitral valve regurgitation. No evidence of mitral valve stenosis. Tricuspid Valve: The tricuspid valve is normal in structure. Tricuspid valve regurgitation is not demonstrated. Aortic Valve: The aortic valve is tricuspid. Aortic  valve regurgitation is not visualized. No aortic stenosis is present. Pulmonic Valve: The pulmonic valve was normal in structure. Pulmonic valve regurgitation is not visualized. Aorta: Aortic dilatation noted. There is mild dilatation of the aortic root measuring 38 mm. There is an aneurysm involving the aortic root. Venous: The inferior vena cava is normal in size with greater than 50% respiratory variability, suggesting right atrial pressure of 3 mmHg. IAS/Shunts: No atrial level shunt detected by color flow Doppler.  LEFT VENTRICLE PLAX 2D LVIDd:         4.90 cm  Diastology LVIDs:         3.20 cm  LV e' lateral:   12.60 cm/s LV PW:         1.10 cm  LV E/e' lateral: 4.5 LV IVS:        1.10 cm  LV e' medial:    6.96 cm/s LVOT diam:     2.40 cm  LV E/e' medial:  8.2 LV SV:         83 LV SV Index:   35 LVOT Area:     4.52 cm  RIGHT VENTRICLE RV S prime:     13.30 cm/s TAPSE (M-mode): 2.8 cm LEFT ATRIUM             Index       RIGHT ATRIUM           Index LA diam:        3.10 cm 1.30 cm/m  RA Area:     22.10 cm LA Vol (A2C):   50.6 ml 21.29 ml/m RA Volume:   71.30 ml  29.99 ml/m LA Vol (A4C):   33.7 ml 14.18 ml/m LA Biplane Vol: 43.2 ml 18.17 ml/m  AORTIC VALVE LVOT Vmax:   89.40 cm/s LVOT Vmean:  67.800 cm/s LVOT VTI:    0.183 m  AORTA Ao Root diam: 3.80 cm Ao Asc diam:  3.40 cm MITRAL VALVE MV Area (PHT): 3.72 cm    SHUNTS MV Decel Time: 204 msec    Systemic VTI:  0.18 m MV E velocity: 57.00  cm/s  Systemic Diam: 2.40 cm MV A velocity: 74.60 cm/s MV E/A ratio:  0.76 Loralie Champagne MD Electronically signed by Loralie Champagne MD Signature Date/Time: 11/05/2019/3:10:45 PM    Final    VAS [place] LOWER EXTREMITY VENOUS (DVT)  Result Date: 11/06/2019  Lower Venous DVTStudy Indications: Pulmonary embolism.  Risk Factors: Confirmed PE. Performing Technologist: Griffin Basil RCT RDMS  Examination Guidelines: A complete evaluation includes B-mode imaging, spectral Doppler, color Doppler, and power Doppler as needed of all accessible portions of each vessel. Bilateral testing is considered an integral part of a complete examination. Limited examinations for reoccurring indications may be performed as noted. The reflux portion of the exam is performed with the patient in reverse Trendelenburg.  +---------+---------------+---------+-----------+----------+--------------+ RIGHT    CompressibilityPhasicitySpontaneityPropertiesThrombus Aging +---------+---------------+---------+-----------+----------+--------------+ CFV      Full           Yes      Yes                                 +---------+---------------+---------+-----------+----------+--------------+ SFJ      Full                                                        +---------+---------------+---------+-----------+----------+--------------+  FV Prox  Full                                                        +---------+---------------+---------+-----------+----------+--------------+ FV DistalFull                                                        +---------+---------------+---------+-----------+----------+--------------+ PFV      Full                                                        +---------+---------------+---------+-----------+----------+--------------+ POP      Full           Yes      Yes                                 +---------+---------------+---------+-----------+----------+--------------+ PTV       Full                                                        +---------+---------------+---------+-----------+----------+--------------+ PERO     Full                                                        +---------+---------------+---------+-----------+----------+--------------+   +---------+---------------+---------+-----------+-----------------+------------+ LEFT     CompressibilityPhasicitySpontaneityProperties       Thrombus                                                                  Aging        +---------+---------------+---------+-----------+-----------------+------------+ CFV      Full           Yes      Yes                                      +---------+---------------+---------+-----------+-----------------+------------+ SFJ      Full                                                             +---------+---------------+---------+-----------+-----------------+------------+ FV Prox  Full                                                             +---------+---------------+---------+-----------+-----------------+------------+  FV Mid   Full                                                             +---------+---------------+---------+-----------+-----------------+------------+ FV DistalFull                                                             +---------+---------------+---------+-----------+-----------------+------------+ PFV      Full                                                             +---------+---------------+---------+-----------+-----------------+------------+ POP      Full           Yes      Yes                                      +---------+---------------+---------+-----------+-----------------+------------+ PTV      Partial                            spongy           Acute                                                    w/compression                  +---------+---------------+---------+-----------+-----------------+------------+ PERO     None                               spongy           Acute                                                    w/compression                 +---------+---------------+---------+-----------+-----------------+------------+     Summary: RIGHT: - There is no evidence of deep vein thrombosis in the lower extremity.  - No cystic structure found in the popliteal fossa.  LEFT: - Findings consistent with acute deep vein thrombosis involving the left posterior tibial veins, and left peroneal veins. - No cystic structure found in the popliteal fossa. - Complete PERO involvement , partial PTV involvement ( dist portion )  *See table(s) above for measurements and observations. Electronically signed by Curt Jews MD on 11/06/2019 at 3:15:36 PM.    Final       The results of significant diagnostics from this hospitalization (including  imaging, microbiology, ancillary and laboratory) are listed below for reference.     Microbiology: Recent Results (from the past 240 hour(s))  SARS Coronavirus 2 by RT PCR (hospital order, performed in [facility] hospital lab) Nasopharyngeal Nasopharyngeal Swab     Status: None   Collection Time: 11/04/19  5:51 PM   Specimen: Nasopharyngeal Swab  Result Value Ref Range Status   SARS Coronavirus 2 NEGATIVE NEGATIVE Final    Comment: (NOTE) SARS-CoV-2 target nucleic acids are NOT DETECTED.  The SARS-CoV-2 RNA is generally detectable in upper and lower respiratory specimens during the acute phase of infection. The lowest concentration of SARS-CoV-2 viral copies this assay can detect is 250 copies / mL. A negative result does not preclude SARS-CoV-2 infection and should not be used as the sole basis for treatment or other patient management decisions.  A negative result may occur with improper specimen collection / handling, submission of specimen other than nasopharyngeal swab,  presence of viral mutation(s) within the areas targeted by this assay, and inadequate number of viral copies (<250 copies / mL). A negative result must be combined with clinical observations, patient history, and epidemiological information.  Fact Sheet for Patients:   StrictlyIdeas.no  Fact Sheet for Healthcare Providers: BankingDealers.co.za  This test is not yet approved or  cleared by the [place] FDA and has been authorized for detection and/or diagnosis of SARS-CoV-2 by FDA under an Emergency Use Authorization (EUA).  This EUA will remain in effect (meaning this test can be used) for the duration of the COVID-19 declaration under Section 564(b)(1) of the Act, 21 U.S.C. section 360bbb-3(b)(1), unless the authorization is terminated or revoked sooner.  Performed at [facility], [address] [place], [place] [address]      Labs: BNP (last 3 results) Recent Labs    11/05/19 0250  BNP 00.9   Basic Metabolic Panel: Recent Labs  Lab 11/02/19 2244 11/04/19 1751 11/05/19 0250 11/06/19 0459 11/07/19 0224  NA 141 135 138 137 136  K 3.7 3.8 4.1 4.0 4.7  CL 103 99 100 102 100  CO2 28 29 28 24 24   GLUCOSE 123* 89 113* 104* 95  BUN 15 12 8 10 14   CREATININE 1.10 0.98 1.09 1.03 0.97  CALCIUM 9.2 8.6* 8.9 8.8* 9.1  MG  --   --   --   --  2.1   Liver Function Tests: Recent Labs  Lab 11/04/19 1751 11/05/19 0250 11/06/19 0459  AST 21 20 20   ALT 21 23 23   ALKPHOS 59 59 54  BILITOT 0.8 1.2 1.1  PROT 7.7 7.4 7.1  ALBUMIN 3.9 3.7 3.4*   No results for input(s): LIPASE, AMYLASE in the last 168 hours. No results for input(s): AMMONIA in the last 168 hours. CBC: Recent Labs  Lab 11/04/19 1751 11/05/19 0026 11/05/19 0250 11/06/19 0459 11/07/19 0224  WBC 7.9 8.0 7.9 7.2 7.3  NEUTROABS 3.7  --  3.8  --   --   HGB 16.0 16.4 16.4 16.0 16.2  HCT 47.9 48.7 47.9 47.4 48.3  MCV 86.2 86.7 86.9 87.1 87.0   PLT 206 198 214 216 224   Cardiac Enzymes: No results for input(s): CKTOTAL, CKMB, CKMBINDEX, TROPONINI in the last 168 hours. BNP: Invalid input(s): POCBNP CBG: No results for input(s): GLUCAP in the last 168 hours. D-Dimer No results for input(s): DDIMER in the last 72 hours. Hgb A1c No results for input(s): HGBA1C in the last 72 hours. Lipid Profile No  results for input(s): CHOL, HDL, LDLCALC, TRIG, CHOLHDL, LDLDIRECT in the last 72 hours. Thyroid function studies No results for input(s): TSH, T4TOTAL, T3FREE, THYROIDAB in the last 72 hours.  Invalid input(s): FREET3 Anemia work up No results for input(s): VITAMINB12, FOLATE, FERRITIN, TIBC, IRON, RETICCTPCT in the last 72 hours. Urinalysis    Component Value Date/Time   COLORURINE YELLOW 03/07/2014 [place] 03/07/2014 1355   LABSPEC 1.014 03/07/2014 1355   PHURINE 7.5 03/07/2014 1355   GLUCOSEU NEGATIVE 03/07/2014 1355   HGBUR MODERATE (A) 03/07/2014 1355   BILIRUBINUR Negative 12/01/2017 1555   KETONESUR NEGATIVE 03/07/2014 1355   PROTEINUR Positive (A) 12/01/2017 1555   PROTEINUR NEGATIVE 03/07/2014 1355   UROBILINOGEN 1.0 12/01/2017 1555   UROBILINOGEN 0.2 03/07/2014 1355   NITRITE Negative 12/01/2017 1555   NITRITE NEGATIVE 03/07/2014 1355   LEUKOCYTESUR Negative 12/01/2017 1555   Sepsis Labs Invalid input(s): PROCALCITONIN,  WBC,  LACTICIDVEN Microbiology Recent Results (from the past 240 hour(s))  SARS Coronavirus 2 by RT PCR (hospital order, performed in [place] hospital lab) Nasopharyngeal Nasopharyngeal Swab     Status: None   Collection Time: 11/04/19  5:51 PM   Specimen: Nasopharyngeal Swab  Result Value Ref Range Status   SARS Coronavirus 2 NEGATIVE NEGATIVE Final    Comment: (NOTE) SARS-CoV-2 target nucleic acids are NOT DETECTED.  The SARS-CoV-2 RNA is generally detectable in upper and lower respiratory specimens during the acute phase of infection. The lowest concentration  of SARS-CoV-2 viral copies this assay can detect is 250 copies / mL. A negative result does not preclude SARS-CoV-2 infection and should not be used as the sole basis for treatment or other patient management decisions.  A negative result may occur with improper specimen collection / handling, submission of specimen other than nasopharyngeal swab, presence of viral mutation(s) within the areas targeted by this assay, and inadequate number of viral copies (<250 copies / mL). A negative result must be combined with clinical observations, patient history, and epidemiological information.  Fact Sheet for Patients:   StrictlyIdeas.no  Fact Sheet for Healthcare Providers: BankingDealers.co.za  This test is not yet approved or  cleared by the [place] FDA and has been authorized for detection and/or diagnosis of SARS-CoV-2 by FDA under an Emergency Use Authorization (EUA).  This EUA will remain in effect (meaning this test can be used) for the duration of the COVID-19 declaration under Section 564(b)(1) of the Act, 21 U.S.C. section 360bbb-3(b)(1), unless the authorization is terminated or revoked sooner.  Performed at [facility], [address] [place], [place] [address]      Time coordinating discharge:  I have spent 35 minutes face to face with the patient and on the ward discussing the patients care, assessment, plan and disposition with other care givers. >50% of the time was devoted counseling the patient about the risks and benefits of treatment/Discharge disposition and coordinating care.   SIGNED:  Dede Query, RN, NP Student  11/07/2019, 9:21 AM   If 7PM-7AM, please contact night-coverage

## 2019-11-08 LAB — PROTHROMBIN GENE MUTATION

## 2019-11-12 ENCOUNTER — Telehealth: Payer: Self-pay | Admitting: Family Medicine

## 2019-11-12 NOTE — Telephone Encounter (Signed)
Janace Hoard is a health case manager with [employer] and is wanting to correspond health care  notes about the pt.  She can be reached at [phone]

## 2019-11-13 ENCOUNTER — Other Ambulatory Visit: Payer: Self-pay

## 2019-11-13 ENCOUNTER — Ambulatory Visit: Payer: No Typology Code available for payment source | Admitting: Family Medicine

## 2019-11-13 ENCOUNTER — Encounter: Payer: Self-pay | Admitting: Family Medicine

## 2019-11-13 VITALS — BP 124/80 | HR 80 | Temp 98.2°F | Wt 264.3 lb

## 2019-11-13 DIAGNOSIS — I2694 Multiple subsegmental pulmonary emboli without acute cor pulmonale: Secondary | ICD-10-CM

## 2019-11-13 DIAGNOSIS — Z23 Encounter for immunization: Secondary | ICD-10-CM | POA: Diagnosis not present

## 2019-11-13 DIAGNOSIS — I2699 Other pulmonary embolism without acute cor pulmonale: Secondary | ICD-10-CM

## 2019-11-13 DIAGNOSIS — Z8249 Family history of ischemic heart disease and other diseases of the circulatory system: Secondary | ICD-10-CM

## 2019-11-13 MED ORDER — APIXABAN 5 MG PO TABS: 5.0000 mg | ORAL_TABLET | Freq: Two times a day (BID) | ORAL | 5 refills | Status: DC

## 2019-11-13 NOTE — Patient Instructions (Addendum)
Starting tomorrow, reduce the Eliquis to 5 mg twice daily.   I will set up cardiology referral.

## 2019-11-13 NOTE — Progress Notes (Signed)
Established Patient Office Visit  Subjective:  Patient ID: Scott Mcconnell, male    DOB: Dec 18, 1970  Age: 49 y.o. MRN: [id number]  CC:  Chief Complaint  Patient presents with  . Hospitalization Follow-up    HPI Scott Mcconnell presents for hospital follow-up from recent pulmonary emboli.  He had presented here with some chest pain and dyspnea after spending about 9 hours in the ER and leaving.  His troponins have been negative.  We obtained a D-dimer which came back elevated and ordered urgent CT angiogram which confirmed bilateral emboli.  He was admitted for further evaluation and treatment and initially placed on heparin and then transition to Lovenox and discharged on Eliquis.  He did have ultrasound lower extremities which confirmed acute DVT T involving the left posterior tibial veins and left peroneal veins.  He feels better at this time.  Less dyspnea.  He has progressed to doing some walking without difficulty.  No further chest pains.  He transitions tomorrow to Eliquis 5 mg twice daily after starting first week at 10 mg twice daily.  He has been advised not to use aspirin and also to avoid regular use of nonsteroidals.  Other medical problems include history of obstructive sleep apnea and hyperlipidemia.  He remains on atorvastatin.  Positive family history of premature CAD.  Prior to diagnosis of his pulmonary emboli we discussed possible cardiology referral.  His dad had coronary disease in his 31s.  Patient denies any exertional chest pain prior to the emboli.  He is a non-smoker.  No diabetes history.  Past Medical History:  Diagnosis Date  . Hyperlipidemia   . HYPERLIPIDEMIA 04/01/2009  . Kidney stones   . Kidney stones   . Sleep apnea   . Sleep apnea     Past Surgical History:  Procedure Laterality Date  . TONSILLECTOMY    . TONSILLECTOMY      Family History  Problem Relation Age of Onset  . Hyperlipidemia Mother   . Cancer Mother 44       Bilateral Breast  Cancer  . Alcohol abuse Father   . Hyperlipidemia Father   . Hypertension Father   . Heart disease Father 69       CAD  . Diabetes Father        grandfather  . Alcohol abuse Paternal Uncle   . Alcohol abuse Paternal Grandfather   . Cancer Other        prostate/grandfather, colon/grandmother  . Alzheimer's disease Other        grandmother  . Sudden death Other        grandfather  . Stroke Other        grandfather  . Hypothyroidism Sister     Social History   Socioeconomic History  . Marital status: Married    Spouse name: Not on file  . Number of children: Not on file  . Years of education: Not on file  . Highest education level: Not on file  Occupational History  . Not on file  Tobacco Use  . Smoking status: Never Smoker  . Smokeless tobacco: Never Used  Vaping Use  . Vaping Use: Never used  Substance and Sexual Activity  . Alcohol use: No  . Drug use: No  . Sexual activity: Not on file  Other Topics Concern  . Not on file  Social History Narrative  . Not on file   Social Determinants of Health   Financial Resource Strain:   . Difficulty  of Paying Living Expenses: Not on file  Food Insecurity:   . Worried About Charity fundraiser in the Last Year: Not on file  . Ran Out of Food in the Last Year: Not on file  Transportation Needs:   . Lack of Transportation (Medical): Not on file  . Lack of Transportation (Non-Medical): Not on file  Physical Activity:   . Days of Exercise per Week: Not on file  . Minutes of Exercise per Session: Not on file  Stress:   . Feeling of Stress : Not on file  Social Connections:   . Frequency of Communication with Friends and Family: Not on file  . Frequency of Social Gatherings with Friends and Family: Not on file  . Attends Religious Services: Not on file  . Active Member of Clubs or Organizations: Not on file  . Attends Archivist Meetings: Not on file  . Marital Status: Not on file  Intimate Partner Violence:    . Fear of Current or Ex-Partner: Not on file  . Emotionally Abused: Not on file  . Physically Abused: Not on file  . Sexually Abused: Not on file    Outpatient Medications Prior to Visit  Medication Sig Dispense Refill  . sildenafil (VIAGRA) 100 MG tablet Take 0.5-1 tablets (50-100 mg total) by mouth daily as needed for erectile dysfunction. 6 tablet 11  . apixaban (ELIQUIS) 5 MG TABS tablet Take 10mg  by mouth twice daily 9/2 - 11/13/2019. On 11/14/2019, decrease to 5mg  twice daily 74 tablet 0   No facility-administered medications prior to visit.    Allergies  Allergen Reactions  . Penicillins     As a child    ROS Review of Systems  Constitutional: Negative for chills and fever.  Respiratory: Negative for cough and shortness of breath.   Cardiovascular: Negative for chest pain, palpitations and leg swelling.  Gastrointestinal: Negative for abdominal pain.  Genitourinary: Negative for dysuria.      Objective:    Physical Exam Vitals reviewed.  Constitutional:      Appearance: Normal appearance.  Cardiovascular:     Rate and Rhythm: Normal rate and regular rhythm.  Pulmonary:     Effort: Pulmonary effort is normal.     Breath sounds: Normal breath sounds. No wheezing or rales.  Musculoskeletal:     Right lower leg: No edema.     Left lower leg: No edema.  Neurological:     Mental Status: He is alert.     BP 124/80 (BP Location: Left Arm, Patient Position: Sitting, Cuff Size: Large)   Pulse 80   Temp 98.2 F (36.8 C) (Oral)   Wt 264 lb 4.8 oz (119.9 kg)   SpO2 94%   BMI 36.86 kg/m  Wt Readings from Last 3 Encounters:  11/13/19 264 lb 4.8 oz (119.9 kg)  11/05/19 265 lb 3.4 oz (120.3 kg)  11/04/19 264 lb (119.7 kg)     Health Maintenance Due  Topic Date Due  . Hepatitis C Screening  Never done  . INFLUENZA VACCINE  10/06/2019    There are no preventive care reminders to display for this patient.  Lab Results  Component Value Date   TSH 2.12  12/28/2018   Lab Results  Component Value Date   WBC 7.3 11/07/2019   HGB 16.2 11/07/2019   HCT 48.3 11/07/2019   MCV 87.0 11/07/2019   PLT 224 11/07/2019   Lab Results  Component Value Date   NA 136 11/07/2019  K 4.7 11/07/2019   CO2 24 11/07/2019   GLUCOSE 95 11/07/2019   BUN 14 11/07/2019   CREATININE 0.97 11/07/2019   BILITOT 1.1 11/06/2019   ALKPHOS 54 11/06/2019   AST 20 11/06/2019   ALT 23 11/06/2019   PROT 7.1 11/06/2019   ALBUMIN 3.4 (L) 11/06/2019   CALCIUM 9.1 11/07/2019   ANIONGAP 12 11/07/2019   GFR 86.38 12/28/2018   Lab Results  Component Value Date   CHOL 145 12/28/2018   Lab Results  Component Value Date   HDL 33.20 (L) 12/28/2018   Lab Results  Component Value Date   LDLCALC 84 12/28/2018   Lab Results  Component Value Date   TRIG 139.0 12/28/2018   Lab Results  Component Value Date   CHOLHDL 4 12/28/2018   No results found for: HGBA1C    Assessment & Plan:   Problem List Items Addressed This Visit      Unprioritized   Multiple subsegmental pulmonary emboli without acute cor pulmonale (HCC)   Relevant Medications   apixaban (ELIQUIS) 5 MG TABS tablet   Acute pulmonary embolism ([place]) - Primary   Relevant Medications   apixaban (ELIQUIS) 5 MG TABS tablet    Patient presented as above with recent dyspnea and chest pain with positive D-dimer and CT angiogram confirming bilateral emboli with subsequent venous Doppler showing acute DVT as above.  Symptomatically improved at this time.  Transition tomorrow to Eliquis 5 mg twice daily.  Treat minimum of 6 months but what we discussed today is considering hematology referral prior to the 69-month interval to get their opinion regarding longevity of treatment.  He does have probable family history of DVT in father.  In some ways this was unprovoked event.  He did have multiple studies done in the hospital looking for hereditary factors which were unrevealing.  We will plan to repeat CBC and  basic metabolic panel at 23-month follow-up  We also discussed per his request cardiology referral.  He has interest in further risk stratification and screening for CAD given his family history.  This will be set up.  His recent symptoms seem to be fairly clearly related to pulmonary emboli as they were very acute in onset and occurred at rest and symptoms have improved after initiation of anticoagulation  Meds ordered this encounter  Medications  . apixaban (ELIQUIS) 5 MG TABS tablet    Sig: Take 1 tablet (5 mg total) by mouth 2 (two) times daily.    Dispense:  60 tablet    Refill:  5    Pt will need refills after 12-06-19    Follow-up: Return in about 4 months (around 03/14/2020).    Carolann Littler, MD

## 2019-11-14 NOTE — Telephone Encounter (Signed)
FYI Spoke to [facility] and she stated that she wanted to just see if the pt was active in the practice and to let us know that PPW was faxed 11/12/2019 to be filled out.

## 2019-11-14 NOTE — Telephone Encounter (Signed)
Scott Mcconnell was returning a call.  Please call her back when possible

## 2019-11-14 NOTE — Telephone Encounter (Signed)
Left detailed message for return call back.  

## 2019-11-15 ENCOUNTER — Telehealth: Payer: Self-pay | Admitting: Family Medicine

## 2019-11-15 NOTE — Telephone Encounter (Signed)
Spoke to pt and he stated that he is having shoulder pain and if it was normal. I advised pt that he needed to be seen for the issue to better assess. Pt stated that he needed an appt today. I advised that there were no appt available. Pt became a little upset and yelled " WHAT IF I DIE? I HAVE A HX OF BLOOD CLOT! I NEED TO BE SEEN TODAY! WILL I LIVE IF I WAIT UNTIL Monday?" I advised pt to go to the [facility] again since we did not have any appt. And he was nervous. I also scheduled pt for Monday the earliest available appt. Pt stated that he would try to go to the UC today and if everything works out he will cancel Monday appt.

## 2019-11-15 NOTE — Telephone Encounter (Signed)
Pt called to say he is having pain in the right side of his shoulder blade. Wants to know if he should make an appointment or is this something that will happen to him from time to time because he has a hx of blood clots.Pain is about a 6   Please call pt with advise

## 2019-11-16 NOTE — Telephone Encounter (Signed)
Shoulder pain would not frequently be seen with pulmonary emboli.  We can see Monday if still having pain.  We are doing the best we can to get patients in .

## 2019-11-18 ENCOUNTER — Encounter: Payer: Self-pay | Admitting: Family Medicine

## 2019-11-18 ENCOUNTER — Other Ambulatory Visit: Payer: Self-pay

## 2019-11-18 ENCOUNTER — Ambulatory Visit: Payer: No Typology Code available for payment source | Admitting: Family Medicine

## 2019-11-18 VITALS — BP 100/62 | HR 84 | Temp 98.1°F | Ht 71.0 in | Wt 236.0 lb

## 2019-11-18 DIAGNOSIS — M549 Dorsalgia, unspecified: Secondary | ICD-10-CM

## 2019-11-18 NOTE — Telephone Encounter (Signed)
Patient notified of update  and verbalized understanding. Pt will be seen this morning.

## 2019-11-18 NOTE — Progress Notes (Signed)
Established Patient Office Visit  Subjective:  Patient ID: Scott Mcconnell, male    DOB: 12/15/1970  Age: 49 y.o. MRN: [id number]  CC:  Chief Complaint  Patient presents with  . shoulder pain    R    HPI LANGDON CROSSON presents for right upper back pain.  He had called on Friday with some failure acute onset right shoulder pain.  He is concerned with his recent bilateral pulmonary emboli and whether this was related to that.  He is taking Eliquis regularly.  Denies any dyspnea.  No pleuritic pain.  No chest pain.  He did start back work last week.  He is left-handed but works his mouse on his computer with his right hand.  He thinks this may be muscular as he can spend hours using his mouse in his keyboard apparently is up fairly high which creates increased tension in his neck and upper back.  He is avoiding nonsteroidals because of his Eliquis. No skin rashes.  Past Medical History:  Diagnosis Date  . Hyperlipidemia   . HYPERLIPIDEMIA 04/01/2009  . Kidney stones   . Kidney stones   . Sleep apnea   . Sleep apnea     Past Surgical History:  Procedure Laterality Date  . TONSILLECTOMY    . TONSILLECTOMY      Family History  Problem Relation Age of Onset  . Hyperlipidemia Mother   . Cancer Mother 17       Bilateral Breast Cancer  . Alcohol abuse Father   . Hyperlipidemia Father   . Hypertension Father   . Heart disease Father 41       CAD  . Diabetes Father        grandfather  . Alcohol abuse Paternal Uncle   . Alcohol abuse Paternal Grandfather   . Cancer Other        prostate/grandfather, colon/grandmother  . Alzheimer's disease Other        grandmother  . Sudden death Other        grandfather  . Stroke Other        grandfather  . Hypothyroidism Sister     Social History   Socioeconomic History  . Marital status: Married    Spouse name: Not on file  . Number of children: Not on file  . Years of education: Not on file  . Highest education level: Not on  file  Occupational History  . Not on file  Tobacco Use  . Smoking status: Never Smoker  . Smokeless tobacco: Never Used  Vaping Use  . Vaping Use: Never used  Substance and Sexual Activity  . Alcohol use: No  . Drug use: No  . Sexual activity: Not on file  Other Topics Concern  . Not on file  Social History Narrative  . Not on file   Social Determinants of Health   Financial Resource Strain:   . Difficulty of Paying Living Expenses: Not on file  Food Insecurity:   . Worried About Charity fundraiser in the Last Year: Not on file  . Ran Out of Food in the Last Year: Not on file  Transportation Needs:   . Lack of Transportation (Medical): Not on file  . Lack of Transportation (Non-Medical): Not on file  Physical Activity:   . Days of Exercise per Week: Not on file  . Minutes of Exercise per Session: Not on file  Stress:   . Feeling of Stress : Not on file  Social Connections:   . Frequency of Communication with Friends and Family: Not on file  . Frequency of Social Gatherings with Friends and Family: Not on file  . Attends Religious Services: Not on file  . Active Member of Clubs or Organizations: Not on file  . Attends Archivist Meetings: Not on file  . Marital Status: Not on file  Intimate Partner Violence:   . Fear of Current or Ex-Partner: Not on file  . Emotionally Abused: Not on file  . Physically Abused: Not on file  . Sexually Abused: Not on file    Outpatient Medications Prior to Visit  Medication Sig Dispense Refill  . apixaban (ELIQUIS) 5 MG TABS tablet Take 1 tablet (5 mg total) by mouth 2 (two) times daily. 60 tablet 5  . atorvastatin (LIPITOR) 20 MG tablet Take 20 mg by mouth daily.    . sildenafil (VIAGRA) 100 MG tablet Take 0.5-1 tablets (50-100 mg total) by mouth daily as needed for erectile dysfunction. 6 tablet 11  . atorvastatin (LIPITOR) 10 MG tablet Take 20 mg by mouth daily.      No facility-administered medications prior to visit.      Allergies  Allergen Reactions  . Penicillins     As a child    ROS Review of Systems  Constitutional: Negative for chills and fever.  Respiratory: Negative for cough and shortness of breath.   Cardiovascular: Negative for chest pain and leg swelling.  Musculoskeletal: Negative for neck pain.  Skin: Negative for rash.      Objective:    Physical Exam Vitals reviewed.  Cardiovascular:     Rate and Rhythm: Normal rate and regular rhythm.  Pulmonary:     Effort: Pulmonary effort is normal.     Breath sounds: Normal breath sounds.  Musculoskeletal:     Cervical back: Neck supple.     Comments: Full range of motion right shoulder.  No biceps tenderness.  No acromioclavicular tenderness.  No subacromial tenderness.  No impingement findings  He is slightly tender to palpation in his right trapezius and rhomboid  Neurological:     Mental Status: He is alert.     BP 100/62   Pulse 84   Temp 98.1 F (36.7 C) (Oral)   Ht 5\' 11"  (1.803 m)   Wt 236 lb (107 kg)   SpO2 97%   BMI 32.92 kg/m  Wt Readings from Last 3 Encounters:  11/18/19 236 lb (107 kg)  11/13/19 264 lb 4.8 oz (119.9 kg)  11/05/19 265 lb 3.4 oz (120.3 kg)     Health Maintenance Due  Topic Date Due  . Hepatitis C Screening  Never done    There are no preventive care reminders to display for this patient.  Lab Results  Component Value Date   TSH 2.12 12/28/2018   Lab Results  Component Value Date   WBC 7.3 11/07/2019   HGB 16.2 11/07/2019   HCT 48.3 11/07/2019   MCV 87.0 11/07/2019   PLT 224 11/07/2019   Lab Results  Component Value Date   NA 136 11/07/2019   K 4.7 11/07/2019   CO2 24 11/07/2019   GLUCOSE 95 11/07/2019   BUN 14 11/07/2019   CREATININE 0.97 11/07/2019   BILITOT 1.1 11/06/2019   ALKPHOS 54 11/06/2019   AST 20 11/06/2019   ALT 23 11/06/2019   PROT 7.1 11/06/2019   ALBUMIN 3.4 (L) 11/06/2019   CALCIUM 9.1 11/07/2019   ANIONGAP 12 11/07/2019   GFR  86.38 12/28/2018    Lab Results  Component Value Date   CHOL 145 12/28/2018   Lab Results  Component Value Date   HDL 33.20 (L) 12/28/2018   Lab Results  Component Value Date   LDLCALC 84 12/28/2018   Lab Results  Component Value Date   TRIG 139.0 12/28/2018   Lab Results  Component Value Date   CHOLHDL 4 12/28/2018   No results found for: HGBA1C    Assessment & Plan:   Problem List Items Addressed This Visit    None    Visit Diagnoses    Upper back pain on right side    -  Primary    Suspect muscular etiology.  -Avoid regular use of nonsteroidals with his Eliquis -Suggested conservative management with heat, muscle massage, topical sports cream -We recommend a look at ergonomics of his current set up with his desk and computer and perhaps look at lowering his keyboard so is not creating as much tension in the upper extremity -Also recommended regular stretching of the back and neck -Could consider nighttime use of muscle relaxer if not improving with the above  No orders of the defined types were placed in this encounter.   Follow-up: No follow-ups on file.    Carolann Littler, MD

## 2019-11-18 NOTE — Patient Instructions (Signed)
May use Tylenol, but avoid regular use of NSAIDS  Consider topical heat.  Consider topical sports cream with muscle massage.

## 2019-12-10 ENCOUNTER — Ambulatory Visit: Payer: No Typology Code available for payment source | Admitting: Cardiology

## 2019-12-10 ENCOUNTER — Encounter: Payer: Self-pay | Admitting: Cardiology

## 2019-12-10 ENCOUNTER — Other Ambulatory Visit: Payer: Self-pay

## 2019-12-10 VITALS — BP 120/80 | HR 68 | Ht 71.0 in | Wt 266.0 lb

## 2019-12-10 DIAGNOSIS — Z8249 Family history of ischemic heart disease and other diseases of the circulatory system: Secondary | ICD-10-CM | POA: Diagnosis not present

## 2019-12-10 DIAGNOSIS — I2699 Other pulmonary embolism without acute cor pulmonale: Secondary | ICD-10-CM | POA: Diagnosis not present

## 2019-12-10 DIAGNOSIS — E785 Hyperlipidemia, unspecified: Secondary | ICD-10-CM | POA: Diagnosis not present

## 2019-12-10 NOTE — Patient Instructions (Signed)
Medication Instructions:  Your physician recommends that you continue on your current medications as directed. Please refer to the Current Medication list given to you today.  *If you need a refill on your cardiac medications before your next appointment, please call your pharmacy*   Lab Work: Please return for FASTING labs ([place])  Our in office lab hours are Monday-Friday 8:00-4:00, closed for lunch 12:45-1:45 pm.  No appointment needed.  Testing/Procedures: CT coronary calcium score. This test is done at [address]. This is $150 out of pocket.   Coronary CalciumScan A coronary calcium scan is an imaging test used to look for deposits of calcium and other fatty materials (plaques) in the inner lining of the blood vessels of the heart (coronary arteries). These deposits of calcium and plaques can partly clog and narrow the coronary arteries without producing any symptoms or warning signs. This puts a person at risk for a heart attack. This test can detect these deposits before symptoms develop. Tell a health care provider about:  Any allergies you have.  All medicines you are taking, including vitamins, herbs, eye drops, creams, and over-the-counter medicines.  Any problems you or family members have had with anesthetic medicines.  Any blood disorders you have.  Any surgeries you have had.  Any medical conditions you have.  Whether you are pregnant or may be pregnant. What are the risks? Generally, this is a safe procedure. However, problems may occur, including:  Harm to a pregnant woman and her unborn baby. This test involves the use of radiation. Radiation exposure can be dangerous to a pregnant woman and her unborn baby. If you are pregnant, you generally should not have this procedure done.  Slight increase in the risk of cancer. This is because of the radiation involved in the test. What happens before the procedure? No preparation is needed for this  procedure. What happens during the procedure?  You will undress and remove any jewelry around your neck or chest.  You will put on a hospital gown.  Sticky electrodes will be placed on your chest. The electrodes will be connected to an electrocardiogram (ECG) machine to record a tracing of the electrical activity of your heart.  A CT scanner will take pictures of your heart. During this time, you will be asked to lie still and hold your breath for 2-3 seconds while a picture of your heart is being taken. The procedure may vary among health care providers and hospitals. What happens after the procedure?  You can get dressed.  You can return to your normal activities.  It is up to you to get the results of your test. Ask your health care provider, or the department that is doing the test, when your results will be ready. Summary  A coronary calcium scan is an imaging test used to look for deposits of calcium and other fatty materials (plaques) in the inner lining of the blood vessels of the heart (coronary arteries).  Generally, this is a safe procedure. Tell your health care provider if you are pregnant or may be pregnant.  No preparation is needed for this procedure.  A CT scanner will take pictures of your heart.  You can return to your normal activities after the scan is done. This information is not intended to replace advice given to you by your health care provider. Make sure you discuss any questions you have with your health care provider. Document Released: 08/20/2007 Document Revised: 01/11/2016 Document Reviewed:  01/11/2016 Elsevier Interactive Patient Education  2017 [place]: At [facility], you and your health needs are our priority.  As part of our continuing mission to provide you with exceptional heart care, we have created designated Provider Care Teams.  These Care Teams include your primary Cardiologist (physician) and Advanced Practice  Providers (APPs -  Physician Assistants and Nurse Practitioners) who all work together to provide you with the care you need, when you need it.  We recommend signing up for the patient portal called "MyChart".  Sign up information is provided on this After Visit Summary.  MyChart is used to connect with patients for Virtual Visits (Telemedicine).  Patients are able to view lab/test results, encounter notes, upcoming appointments, etc.  Non-urgent messages can be sent to your provider as well.   To learn more about what you can do with MyChart, go to [email or web address].    Your next appointment:   12 month(s)  The format for your next appointment:   In Person  Provider:   Oswaldo Milian, MD

## 2019-12-10 NOTE — Progress Notes (Signed)
Cardiology Office Note:    Date:  12/10/2019   ID:  Scott Mcconnell, DOB 1970/06/25, MRN [id number]  PCP:  Eulas Post, MD  Cardiologist:  No primary care provider on file.  Electrophysiologist:  None   Referring MD: Eulas Post, MD   Chief Complaint  Patient presents with  . Hyperlipidemia    History of Present Illness:    Scott Mcconnell is a 49 y.o. male with a hx of pulmonary embolism, hyperlipidemia, OSA who is referred by Dr. Elease Hashimoto for evaluation of family history of CAD.  He was admitted from 8/30 through 11/07/2019 with acute PE, for which she was started on Eliquis.  Echocardiogram showed LVEF 55 to 32%, grade 1 diastolic dysfunction, normal RV size and function, no significant valvular disease.  He reports no bleeding issues on Eliquis.  Reports compliance with his CPAP.  States that he exercises by going for walks with his puppy multiple times per day.  States that he walks in the morning for 15 to 20 minutes, then 15 to 30 minutes at lunch, and 15 to 30 minutes in the evening.  He was doing CrossFit 3 times per week prior to his pulmonary embolism.  He denies any exertional chest pain or dyspnea.  Does report occasional lightheadedness with standing.  Denies any lower extremity edema or palpitations.  No smoking history.  Family history includes father had 2 MIs, first in late 54s.   Past Medical History:  Diagnosis Date  . Hyperlipidemia   . HYPERLIPIDEMIA 04/01/2009  . Kidney stones   . Kidney stones   . Sleep apnea   . Sleep apnea     Past Surgical History:  Procedure Laterality Date  . TONSILLECTOMY    . TONSILLECTOMY      Mcconnell Medications: Mcconnell Meds  Medication Sig  . apixaban (ELIQUIS) 5 MG TABS tablet Take 1 tablet (5 mg total) by mouth 2 (two) times daily.  Marland Kitchen atorvastatin (LIPITOR) 20 MG tablet Take 20 mg by mouth daily.  . sildenafil (VIAGRA) 100 MG tablet Take 0.5-1 tablets (50-100 mg total) by mouth daily as needed for erectile  dysfunction.     Allergies:   Penicillins   Social History   Socioeconomic History  . Marital status: Married    Spouse name: Not on file  . Number of children: Not on file  . Years of education: Not on file  . Highest education level: Not on file  Occupational History  . Not on file  Tobacco Use  . Smoking status: Never Smoker  . Smokeless tobacco: Never Used  Vaping Use  . Vaping Use: Never used  Substance and Sexual Activity  . Alcohol use: No  . Drug use: No  . Sexual activity: Not on file  Other Topics Concern  . Not on file  Social History Narrative  . Not on file   Social Determinants of Health   Financial Resource Strain:   . Difficulty of Paying Living Expenses: Not on file  Food Insecurity:   . Worried About Charity fundraiser in the Last Year: Not on file  . Ran Out of Food in the Last Year: Not on file  Transportation Needs:   . Lack of Transportation (Medical): Not on file  . Lack of Transportation (Non-Medical): Not on file  Physical Activity:   . Days of Exercise per Week: Not on file  . Minutes of Exercise per Session: Not on file  Stress:   . Feeling  of Stress : Not on file  Social Connections:   . Frequency of Communication with Friends and Family: Not on file  . Frequency of Social Gatherings with Friends and Family: Not on file  . Attends Religious Services: Not on file  . Active Member of Clubs or Organizations: Not on file  . Attends Archivist Meetings: Not on file  . Marital Status: Not on file     Family History: The patient's family history includes Alcohol abuse in his father, paternal grandfather, and paternal uncle; Alzheimer's disease in an other family member; Cancer in an other family member; Cancer (age of onset: 79) in his mother; Diabetes in his father; Heart disease (age of onset: 54) in his father; Hyperlipidemia in his father and mother; Hypertension in his father; Hypothyroidism in his sister; Stroke in an other  family member; Sudden death in an other family member.  ROS:   Please see the history of present illness.     All other systems reviewed and are negative.  EKGs/Labs/Other Studies Reviewed:    The following studies were reviewed today:   EKG:  EKG is  ordered today.  The ekg ordered today demonstrates normal sinus rhythm, rate 68, no ST/T abnormalities  Recent Labs: 12/28/2018: TSH 2.12 11/05/2019: B Natriuretic Peptide 69.6 11/06/2019: ALT 23 11/07/2019: BUN 14; Creatinine, Ser 0.97; Hemoglobin 16.2; Magnesium 2.1; Platelets 224; Potassium 4.7; Sodium 136  Recent Lipid Panel    Component Value Date/Time   CHOL 145 12/28/2018 0821   TRIG 139.0 12/28/2018 0821   HDL 33.20 (L) 12/28/2018 0821   CHOLHDL 4 12/28/2018 0821   VLDL 27.8 12/28/2018 0821   LDLCALC 84 12/28/2018 0821   LDLDIRECT 139.0 12/07/2016 1002    Physical Exam:    VS:  BP 120/80   Pulse 68   Ht 5\' 11"  (1.803 m)   Wt 266 lb (120.7 kg)   SpO2 98%   BMI 37.10 kg/m     Wt Readings from Last 3 Encounters:  12/10/19 266 lb (120.7 kg)  11/18/19 236 lb (107 kg)  11/13/19 264 lb 4.8 oz (119.9 kg)     GEN:  Well nourished, well developed in no acute distress HEENT: Normal NECK: No JVD; No carotid bruits LYMPHATICS: No lymphadenopathy CARDIAC: RRR, no murmurs, rubs, gallops RESPIRATORY:  Clear to auscultation without rales, wheezing or rhonchi  ABDOMEN: Soft, non-tender, non-distended MUSCULOSKELETAL:  No edema; No deformity  SKIN: Warm and dry NEUROLOGIC:  Alert and oriented x 3 PSYCHIATRIC:  Normal affect   ASSESSMENT:    1. Family history of premature CAD   2. Hyperlipidemia, unspecified hyperlipidemia type   3. Acute pulmonary embolism without acute cor pulmonale, unspecified pulmonary embolism type (HCC)    PLAN:     Hyperlipidemia: LDL 84 on 12/28/2018, on atorvastatin 20 mg daily.  Significant family history of CAD.  We will recheck lipid panel.  Will check calcium score to guide aggressiveness  in lowering cholesterol.  PE: Diagnosed last month, continue Eliquis 5 mg BID  OSA: Reports compliance with CPAP  RTC in 1 year  Medication Adjustments/Labs and Tests Ordered: Mcconnell medicines are reviewed at length with the patient today.  Concerns regarding medicines are outlined above.  Orders Placed This Encounter  Procedures  . CT CARDIAC SCORING  . Lipid panel  . EKG 12-Lead   No orders of the defined types were placed in this encounter.   Patient Instructions  Medication Instructions:  Your physician recommends that you continue on  your Mcconnell medications as directed. Please refer to the Mcconnell Medication list given to you today.  *If you need a refill on your cardiac medications before your next appointment, please call your pharmacy*   Lab Work: Please return for FASTING labs (Lipid)  Our in office lab hours are Monday-Friday 8:00-4:00, closed for lunch 12:45-1:45 pm.  No appointment needed.  Testing/Procedures: CT coronary calcium score. This test is done at [address]. This is $150 out of pocket.   Coronary CalciumScan A coronary calcium scan is an imaging test used to look for deposits of calcium and other fatty materials (plaques) in the inner lining of the blood vessels of the heart (coronary arteries). These deposits of calcium and plaques can partly clog and narrow the coronary arteries without producing any symptoms or warning signs. This puts a person at risk for a heart attack. This test can detect these deposits before symptoms develop. Tell a health care provider about:  Any allergies you have.  All medicines you are taking, including vitamins, herbs, eye drops, creams, and over-the-counter medicines.  Any problems you or family members have had with anesthetic medicines.  Any blood disorders you have.  Any surgeries you have had.  Any medical conditions you have.  Whether you are pregnant or may be pregnant. What are  the risks? Generally, this is a safe procedure. However, problems may occur, including:  Harm to a pregnant woman and her unborn baby. This test involves the use of radiation. Radiation exposure can be dangerous to a pregnant woman and her unborn baby. If you are pregnant, you generally should not have this procedure done.  Slight increase in the risk of cancer. This is because of the radiation involved in the test. What happens before the procedure? No preparation is needed for this procedure. What happens during the procedure?  You will undress and remove any jewelry around your neck or chest.  You will put on a hospital gown.  Sticky electrodes will be placed on your chest. The electrodes will be connected to an electrocardiogram (ECG) machine to record a tracing of the electrical activity of your heart.  A CT scanner will take pictures of your heart. During this time, you will be asked to lie still and hold your breath for 2-3 seconds while a picture of your heart is being taken. The procedure may vary among health care providers and hospitals. What happens after the procedure?  You can get dressed.  You can return to your normal activities.  It is up to you to get the results of your test. Ask your health care provider, or the department that is doing the test, when your results will be ready. Summary  A coronary calcium scan is an imaging test used to look for deposits of calcium and other fatty materials (plaques) in the inner lining of the blood vessels of the heart (coronary arteries).  Generally, this is a safe procedure. Tell your health care provider if you are pregnant or may be pregnant.  No preparation is needed for this procedure.  A CT scanner will take pictures of your heart.  You can return to your normal activities after the scan is done. This information is not intended to replace advice given to you by your health care provider. Make sure you discuss any  questions you have with your health care provider. Document Released: 08/20/2007 Document Revised: 01/11/2016 Document Reviewed: 01/11/2016 Elsevier Interactive Patient Education  2017 [employer].  Follow-Up: At [facility], you and your health needs are our priority.  As part of our continuing mission to provide you with exceptional heart care, we have created designated Provider Care Teams.  These Care Teams include your primary Cardiologist (physician) and Advanced Practice Providers (APPs -  Physician Assistants and Nurse Practitioners) who all work together to provide you with the care you need, when you need it.  We recommend signing up for the patient portal called "MyChart".  Sign up information is provided on this After Visit Summary.  MyChart is used to connect with patients for Virtual Visits (Telemedicine).  Patients are able to view lab/test results, encounter notes, upcoming appointments, etc.  Non-urgent messages can be sent to your provider as well.   To learn more about what you can do with MyChart, go to [email or web address].    Your next appointment:   12 month(s)  The format for your next appointment:   In Person  Provider:   Oswaldo Milian, MD         Signed, Donato Heinz, MD  12/10/2019 3:15 PM    [place]

## 2019-12-13 LAB — LIPID PANEL
Chol/HDL Ratio: 3.8 ratio (ref 0.0–5.0)
Cholesterol, Total: 139 mg/dL (ref 100–199)
HDL: 37 mg/dL — ABNORMAL LOW (ref >39)
LDL Chol Calc (NIH): 82 mg/dL (ref 0–99)
Triglycerides: 106 mg/dL (ref 0–149)
VLDL Cholesterol Cal: 20 mg/dL (ref 5–40)

## 2019-12-24 ENCOUNTER — Other Ambulatory Visit: Payer: Self-pay

## 2019-12-24 ENCOUNTER — Ambulatory Visit (INDEPENDENT_AMBULATORY_CARE_PROVIDER_SITE_OTHER)
Admission: RE | Admit: 2019-12-24 | Discharge: 2019-12-24 | Disposition: A | Discharge status: Home / Self Care | Payer: Self-pay | Source: Ambulatory Visit | Attending: Cardiology | Admitting: Cardiology

## 2019-12-24 DIAGNOSIS — E785 Hyperlipidemia, unspecified: Secondary | ICD-10-CM

## 2019-12-24 DIAGNOSIS — Z8249 Family history of ischemic heart disease and other diseases of the circulatory system: Secondary | ICD-10-CM

## 2019-12-27 ENCOUNTER — Other Ambulatory Visit: Payer: Self-pay | Admitting: *Deleted

## 2019-12-27 MED ORDER — ATORVASTATIN CALCIUM 40 MG PO TABS
40.0000 mg | ORAL_TABLET | Freq: Every day | ORAL | 3 refills | Status: DC
Start: 2019-12-27 — End: 2020-12-28

## 2020-01-29 ENCOUNTER — Ambulatory Visit: Payer: No Typology Code available for payment source | Admitting: Family Medicine

## 2020-01-29 ENCOUNTER — Other Ambulatory Visit: Payer: Self-pay

## 2020-01-29 ENCOUNTER — Encounter: Payer: Self-pay | Admitting: Family Medicine

## 2020-01-29 VITALS — BP 110/80 | HR 74 | Temp 98.1°F | Wt 270.4 lb

## 2020-01-29 DIAGNOSIS — R300 Dysuria: Secondary | ICD-10-CM

## 2020-01-29 DIAGNOSIS — M545 Low back pain, unspecified: Secondary | ICD-10-CM

## 2020-01-29 LAB — POCT URINALYSIS DIPSTICK
Bilirubin, UA: NEGATIVE
Blood, UA: NEGATIVE
Glucose, UA: NEGATIVE
Ketones, UA: NEGATIVE
Leukocytes, UA: NEGATIVE
Nitrite, UA: NEGATIVE
Protein, UA: NEGATIVE
Spec Grav, UA: 1.025 (ref 1.010–1.025)
Urobilinogen, UA: 0.2 E.U./dL
pH, UA: 6 (ref 5.0–8.0)

## 2020-01-29 MED ORDER — CYCLOBENZAPRINE HCL 10 MG PO TABS: 10.0000 mg | ORAL_TABLET | Freq: Every day | ORAL | 0 refills | Status: DC

## 2020-01-29 NOTE — Patient Instructions (Signed)
Lumbar Sprain A lumbar sprain, which is sometimes called a low-back sprain, is a stretch or tear in the bands of tissue that hold bones and joints together (ligaments) in the lower back (lumbar spine). This type of injury occurs when you overextend or stretch these ligaments beyond their limits. Lumbar sprains can range from mild to severe. Mild sprains may involve stretching a ligament without tearing it. These may heal in 1-2 weeks. More severe sprains involve tearing of the ligament. These will cause more pain and may take 6-8 weeks to heal. What are the causes? This condition may be caused by:  Trauma, such as a fall or a hit to the body.  Twisting or overstretching the back. This may result from doing activities that need a lot of energy, such as lifting heavy objects. What increases the risk? This injury is more common in:  Athletes.  People with obesity.  People who do repeated lifting, bending, or other movements that involve their back. What are the signs or symptoms? Symptoms of this condition may include:  Sharp or dull pain in the lower back that does not go away. The pain may extend to the buttocks.  Stiffness or limited range of motion.  Sudden muscle tightening (spasms). How is this diagnosed? This condition may be diagnosed based on:  Your symptoms.  Your medical history.  A physical exam.  Imaging tests, such as: ? X-rays. ? MRI. How is this treated? Treatment for this condition may include:  Resting the injured area.  Applying heat and cold to the affected area.  Medicines for pain and inflammation, such as NSAIDs.  Prescription pain medicine and muscle relaxants may be needed for a short time.  Physical therapy. Follow these instructions at home: Managing pain, stiffness, and swelling      If directed, put ice on the injured area during the first 24 hours after your injury. ? Put ice in a plastic bag. ? Place a towel between your skin and  the bag. ? Leave the ice on for 20 minutes, 2-3 times a day.  If directed, apply heat to the affected area as often as told by your health care provider. Use the heat source that your health care provider recommends, such as a moist heat pack or a heating pad. ? Place a towel between your skin and the heat source. ? Leave the heat on for 20-30 minutes. ? Remove the heat if your skin turns bright red. This is especially important if you are unable to feel pain, heat, or cold. You may have a greater risk of getting burned. Activity  Rest and return to your normal activities as told by your health care provider. Ask your health care provider what activities are safe for you.  Do exercises as told by your health care provider. Medicines  Take over-the-counter and prescription medicines only as told by your health care provider.  Ask your health care provider if the medicine prescribed to you: ? Requires you to avoid driving or using heavy machinery. ? Can cause constipation. You may need to take these actions to prevent or treat constipation:  Drink enough fluid to keep your urine pale yellow.  Take over-the-counter or prescription medicines.  Eat foods that are high in fiber, such as beans, whole grains, and fresh fruits and vegetables.  Limit foods that are high in fat and processed sugars, such as fried or sweet foods. Injury prevention To prevent a future low-back injury:  Always warm up properly  before physical activity or sports.  Cool down and stretch after being active.  Use correct form when playing sports and lifting heavy objects. Bend your knees before you lift heavy objects.  Use good posture when sitting and standing.  Stay physically fit and keep a healthy weight. ? Do at least 150 minutes of moderate-intensity exercise each week, such as brisk walking or water aerobics. ? Do strength exercises at least 2 times each week.  General instructions  Do not use any  products that contain nicotine or tobacco, such as cigarettes, e-cigarettes, and chewing tobacco. If you need help quitting, ask your health care provider.  Keep all follow-up visits as told by your health care provider. This is important. Contact a health care provider if:  Your back pain does not improve after 6 weeks of treatment.  Your symptoms get worse. Get help right away if:  Your back pain is severe.  You are unable to stand or walk.  You develop pain in your legs.  You develop weakness in your buttocks or legs.  You have difficulty controlling when you urinate or when you have a bowel movement. ? You have frequent, painful, or bloody urination. ? You have a temperature over 101.40F (38.3C). Summary  A lumbar sprain, which is sometimes called a low-back sprain, is a stretch or tear in the bands of tissue that hold bones and joints together (ligaments) in the lower back (lumbar spine).  Rest and return to your normal activities as told by your health care provider. Ask your health care provider what activities are safe for you.  If directed, apply heat and ice to the affected area as often as told by your health care provider.  Take over-the-counter and prescription medicines only as told by your health care provider.  Contact a health care provider if you have new or worsening symptoms. This information is not intended to replace advice given to you by your health care provider. Make sure you discuss any questions you have with your health care provider. Document Revised: 12/21/2017 Document Reviewed: 12/21/2017 Elsevier Patient Education  [place].

## 2020-01-29 NOTE — Progress Notes (Signed)
Established Patient Office Visit  Subjective:  Patient ID: Scott Mcconnell, male    DOB: 15-Oct-1970  Age: 49 y.o. MRN: [id number]  CC:  Chief Complaint  Patient presents with  . Back Pain  . thigh pain  . Dysuria    HPI Scott Mcconnell presents for bilateral low back pain. He has some radiation into the posterior thighs bilaterally. He states last Sunday he was cleaning a closet. He noticed some discomfort by that night. Denies any specific injury. He denies any numbness or weakness lower extremities. No urine or stool incontinence. He tried some heat and Tylenol which help temporarily. He is on anticoagulant because of prior history of pulmonary emboli and avoids nonsteroidals. He had some polyuria couple nights ago he had been drinking increased fluids. He had prior history of kidney stones and initially wondered if this may be kidney stone related. No real flank pain. Location is lower lumbar and bilateral.  Past Medical History:  Diagnosis Date  . Hyperlipidemia   . HYPERLIPIDEMIA 04/01/2009  . Kidney stones   . Kidney stones   . Sleep apnea   . Sleep apnea     Past Surgical History:  Procedure Laterality Date  . TONSILLECTOMY    . TONSILLECTOMY      Family History  Problem Relation Age of Onset  . Hyperlipidemia Mother   . Cancer Mother 84       Bilateral Breast Cancer  . Alcohol abuse Father   . Hyperlipidemia Father   . Hypertension Father   . Heart disease Father 34       CAD  . Diabetes Father        grandfather  . Alcohol abuse Paternal Uncle   . Alcohol abuse Paternal Grandfather   . Cancer Other        prostate/grandfather, colon/grandmother  . Alzheimer's disease Other        grandmother  . Sudden death Other        grandfather  . Stroke Other        grandfather  . Hypothyroidism Sister     Social History   Socioeconomic History  . Marital status: Married    Spouse name: Not on file  . Number of children: Not on file  . Years of education:  Not on file  . Highest education level: Not on file  Occupational History  . Not on file  Tobacco Use  . Smoking status: Never Smoker  . Smokeless tobacco: Never Used  Vaping Use  . Vaping Use: Never used  Substance and Sexual Activity  . Alcohol use: No  . Drug use: No  . Sexual activity: Not on file  Other Topics Concern  . Not on file  Social History Narrative  . Not on file   Social Determinants of Health   Financial Resource Strain:   . Difficulty of Paying Living Expenses: Not on file  Food Insecurity:   . Worried About Charity fundraiser in the Last Year: Not on file  . Ran Out of Food in the Last Year: Not on file  Transportation Needs:   . Lack of Transportation (Medical): Not on file  . Lack of Transportation (Non-Medical): Not on file  Physical Activity:   . Days of Exercise per Week: Not on file  . Minutes of Exercise per Session: Not on file  Stress:   . Feeling of Stress : Not on file  Social Connections:   . Frequency of Communication with  Friends and Family: Not on file  . Frequency of Social Gatherings with Friends and Family: Not on file  . Attends Religious Services: Not on file  . Active Member of Clubs or Organizations: Not on file  . Attends Archivist Meetings: Not on file  . Marital Status: Not on file  Intimate Partner Violence:   . Fear of Current or Ex-Partner: Not on file  . Emotionally Abused: Not on file  . Physically Abused: Not on file  . Sexually Abused: Not on file    Outpatient Medications Prior to Visit  Medication Sig Dispense Refill  . apixaban (ELIQUIS) 5 MG TABS tablet Take 1 tablet (5 mg total) by mouth 2 (two) times daily. 60 tablet 5  . atorvastatin (LIPITOR) 40 MG tablet Take 1 tablet (40 mg total) by mouth daily. 90 tablet 3  . sildenafil (VIAGRA) 100 MG tablet Take 0.5-1 tablets (50-100 mg total) by mouth daily as needed for erectile dysfunction. 6 tablet 11   No facility-administered medications prior to  visit.    Allergies  Allergen Reactions  . Penicillins     As a child    ROS Review of Systems  Constitutional: Negative for chills and fever.  Musculoskeletal: Positive for back pain.  Neurological: Negative for weakness and numbness.      Objective:    Physical Exam Vitals reviewed.  Constitutional:      Appearance: Normal appearance.  Cardiovascular:     Rate and Rhythm: Normal rate and regular rhythm.  Pulmonary:     Effort: Pulmonary effort is normal.     Breath sounds: Normal breath sounds.  Musculoskeletal:     Comments: Straight leg raise are negative. No localized tenderness in lower back region.  Neurological:     Mental Status: He is alert.     Comments: Full strength lower extremities. Symmetric reflexes.     BP 110/80 (BP Location: Left Arm, Patient Position: Sitting, Cuff Size: Large)   Pulse 74   Temp 98.1 F (36.7 C) (Oral)   Wt 270 lb 6.4 oz (122.7 kg)   SpO2 96%   BMI 37.71 kg/m  Wt Readings from Last 3 Encounters:  01/29/20 270 lb 6.4 oz (122.7 kg)  12/10/19 266 lb (120.7 kg)  11/18/19 236 lb (107 kg)     Health Maintenance Due  Topic Date Due  . Hepatitis C Screening  Never done  . COLONOSCOPY  Never done    There are no preventive care reminders to display for this patient.  Lab Results  Component Value Date   TSH 2.12 12/28/2018   Lab Results  Component Value Date   WBC 7.3 11/07/2019   HGB 16.2 11/07/2019   HCT 48.3 11/07/2019   MCV 87.0 11/07/2019   PLT 224 11/07/2019   Lab Results  Component Value Date   NA 136 11/07/2019   K 4.7 11/07/2019   CO2 24 11/07/2019   GLUCOSE 95 11/07/2019   BUN 14 11/07/2019   CREATININE 0.97 11/07/2019   BILITOT 1.1 11/06/2019   ALKPHOS 54 11/06/2019   AST 20 11/06/2019   ALT 23 11/06/2019   PROT 7.1 11/06/2019   ALBUMIN 3.4 (L) 11/06/2019   CALCIUM 9.1 11/07/2019   ANIONGAP 12 11/07/2019   GFR 86.38 12/28/2018   Lab Results  Component Value Date   CHOL 139 12/13/2019    Lab Results  Component Value Date   HDL 37 (L) 12/13/2019   Lab Results  Component Value Date  [place] 82 12/13/2019   Lab Results  Component Value Date   TRIG 106 12/13/2019   Lab Results  Component Value Date   CHOLHDL 3.8 12/13/2019   No results found for: HGBA1C    Assessment & Plan:   Bilateral back pain. Suspect muscular. Nonfocal exam Urinalysis unremarkable  -Recommend conservative measures with heat, muscle massage, topical sports creams and we gave him some extension stretches. Increase walking as tolerated  -Wrote for limited Flexeril 10 mg nightly for muscle spasm. He is aware this may cause some sedation  Meds ordered this encounter  Medications  . cyclobenzaprine (FLEXERIL) 10 MG tablet    Sig: Take 1 tablet (10 mg total) by mouth at bedtime.    Dispense:  20 tablet    Refill:  0    Follow-up: No follow-ups on file.    Carolann Littler, MD

## 2020-02-07 ENCOUNTER — Telehealth: Payer: Self-pay | Admitting: Family Medicine

## 2020-02-07 NOTE — Telephone Encounter (Signed)
Group Accident Claim Form and Group Critical Illness Claim Form to be filled out- placed in dr's folder.  Call [phone] upon completion.  Patient or wife Scott Mcconnell will pick up form.

## 2020-02-10 NOTE — Telephone Encounter (Signed)
I filled out what I could - in your red folder on your desk.

## 2020-02-10 NOTE — Telephone Encounter (Signed)
Thanks.  I will get to it as soon as possible.

## 2020-02-13 NOTE — Telephone Encounter (Signed)
Pt is calling to check the status of the form that he dropped off and he is aware of the 3-5 business days for completion of the forms.

## 2020-02-17 ENCOUNTER — Telehealth: Payer: Self-pay

## 2020-02-17 NOTE — Telephone Encounter (Signed)
Pt contacted to come into the office to pick paperwork up. Pt stated himself or wife would come in to get paperwork.

## 2020-02-17 NOTE — Telephone Encounter (Signed)
Form done 

## 2020-02-25 ENCOUNTER — Telehealth: Payer: Self-pay | Admitting: Family Medicine

## 2020-02-25 DIAGNOSIS — G4733 Obstructive sleep apnea (adult) (pediatric): Secondary | ICD-10-CM

## 2020-02-25 DIAGNOSIS — R300 Dysuria: Secondary | ICD-10-CM

## 2020-02-25 NOTE — Telephone Encounter (Signed)
DME order placed for [place] q-nasal mask.  Please send to number listed with patient's name and telephone number.

## 2020-02-25 NOTE — Telephone Encounter (Signed)
Patient is needing a Rx sent to [employer]: [phone]  Fax: [phone]  For a CPAP supply dept  Patient needs a pilairo q-nasal mask  Make sure that the patients name and telephone number is on the Rx  Please advise

## 2020-02-27 NOTE — Telephone Encounter (Signed)
Patient called back and stated that the DME company gave the wrong fax number and the number is [phone].

## 2020-02-27 NOTE — Telephone Encounter (Signed)
DME order and demographics information sheet was faxed to [facility] at [phone].

## 2020-03-09 ENCOUNTER — Other Ambulatory Visit: Payer: Self-pay | Admitting: Family Medicine

## 2020-06-08 ENCOUNTER — Other Ambulatory Visit: Payer: Self-pay

## 2020-06-08 ENCOUNTER — Encounter: Payer: Self-pay | Admitting: Family Medicine

## 2020-06-08 ENCOUNTER — Ambulatory Visit (INDEPENDENT_AMBULATORY_CARE_PROVIDER_SITE_OTHER): Payer: No Typology Code available for payment source | Admitting: Family Medicine

## 2020-06-08 VITALS — BP 116/82 | HR 80 | Temp 98.0°F | Ht 71.0 in | Wt 273.9 lb

## 2020-06-08 DIAGNOSIS — Z125 Encounter for screening for malignant neoplasm of prostate: Secondary | ICD-10-CM

## 2020-06-08 DIAGNOSIS — Z23 Encounter for immunization: Secondary | ICD-10-CM

## 2020-06-08 DIAGNOSIS — Z0000 Encounter for general adult medical examination without abnormal findings: Secondary | ICD-10-CM

## 2020-06-08 DIAGNOSIS — J029 Acute pharyngitis, unspecified: Secondary | ICD-10-CM | POA: Diagnosis not present

## 2020-06-08 DIAGNOSIS — I2699 Other pulmonary embolism without acute cor pulmonale: Secondary | ICD-10-CM | POA: Diagnosis not present

## 2020-06-08 LAB — CBC WITH DIFFERENTIAL/PLATELET
Basophils Absolute: 0 10*3/uL (ref 0.0–0.1)
Basophils Relative: 0.5 % (ref 0.0–3.0)
Eosinophils Absolute: 0.2 10*3/uL (ref 0.0–0.7)
Eosinophils Relative: 3 % (ref 0.0–5.0)
HCT: 47.4 % (ref 39.0–52.0)
Hemoglobin: 16.4 g/dL (ref 13.0–17.0)
Lymphocytes Relative: 40.2 % (ref 12.0–46.0)
Lymphs Abs: 2.4 10*3/uL (ref 0.7–4.0)
MCHC: 34.6 g/dL (ref 30.0–36.0)
MCV: 86 fl (ref 78.0–100.0)
Monocytes Absolute: 0.6 10*3/uL (ref 0.1–1.0)
Monocytes Relative: 10.9 % (ref 3.0–12.0)
Neutro Abs: 2.7 10*3/uL (ref 1.4–7.7)
Neutrophils Relative %: 45.4 % (ref 43.0–77.0)
Platelets: 211 10*3/uL (ref 150.0–400.0)
RBC: 5.51 Mil/uL (ref 4.22–5.81)
RDW: 13.1 % (ref 11.5–15.5)
WBC: 5.9 10*3/uL (ref 4.0–10.5)

## 2020-06-08 LAB — HEPATIC FUNCTION PANEL
ALT: 39 U/L (ref 0–53)
AST: 21 U/L (ref 0–37)
Albumin: 4.1 g/dL (ref 3.5–5.2)
Alkaline Phosphatase: 47 U/L (ref 39–117)
Bilirubin, Direct: 0.2 mg/dL (ref 0.0–0.3)
Total Bilirubin: 0.9 mg/dL (ref 0.2–1.2)
Total Protein: 7.4 g/dL (ref 6.0–8.3)

## 2020-06-08 LAB — BASIC METABOLIC PANEL
BUN: 14 mg/dL (ref 6–23)
CO2: 30 mEq/L (ref 19–32)
Calcium: 9.2 mg/dL (ref 8.4–10.5)
Chloride: 101 mEq/L (ref 96–112)
Creatinine, Ser: 0.84 mg/dL (ref 0.40–1.50)
GFR: 101.77 mL/min (ref >60.00)
Glucose, Bld: 97 mg/dL (ref 70–99)
Potassium: 4.2 mEq/L (ref 3.5–5.1)
Sodium: 140 mEq/L (ref 135–145)

## 2020-06-08 LAB — LIPID PANEL
Cholesterol: 137 mg/dL (ref 0–200)
HDL: 34.3 mg/dL — ABNORMAL LOW (ref >39.00)
LDL Cholesterol: 71 mg/dL (ref 0–99)
NonHDL: 102.69
Total CHOL/HDL Ratio: 4
Triglycerides: 157 mg/dL — ABNORMAL HIGH (ref 0.0–149.0)
VLDL: 31.4 mg/dL (ref 0.0–40.0)

## 2020-06-08 LAB — TSH: TSH: 3.18 u[IU]/mL (ref 0.35–4.50)

## 2020-06-08 LAB — PSA: PSA: 1.04 ng/mL (ref 0.10–4.00)

## 2020-06-08 NOTE — Progress Notes (Signed)
Established Patient Office Visit  Subjective:  Patient ID: Scott Mcconnell, male    DOB: [birth year]  Age: 49 y.o. MRN: [id number]  CC:  Chief Complaint  Patient presents with  . Annual Exam    No new cocnerns    HPI Scott Mcconnell presents for physical exam-and additional acute issue as below.  Generally doing fairly well.  He does have a sore throat which started over the weekend.  No nasal congestion.  No cough.  He states his throat feels slightly better today.  This tends to be sore early in the morning and then improves through the day.  No sick contacts.  He has history of obstructive sleep apnea and uses CPAP nightly.  He had acute pulmonary embolus back in August.  This was basically unprovoked.  He does sit at his job several hours per day and works from home.  Non-smoker.  No hormone use.  He does think he has positive family history with father having DVT.  He has been on Eliquis since then.  We had discussed possible hematology consult to get their opinion regarding chronicity of use of anticoagulation.  Patient has been walking about 45 minutes/day.  He had multiple studies done during his hospitalization back in August including cardiolipin antibodies, prothrombin gene mutation, factor V Leiden, homocystine, beta-2 glycoprotein antibodies, lupus anticoagulant panel, protein S total and activity, protein C total and activity, Antithrombin III  Had previous coronary calcium score of 80 which placed him in the 88th percentile.  His Lipitor was increased to 40 mg per cardiology.  His father had coronary disease age 1  He does relate onset of sore throat over the weekend.  On Saturday he had low-grade fever 100.3.  Rapid Covid test at home was negative.  No sick contacts.  Denies any cough or nasal congestion.  Symptoms slightly improved today.  Health maintenance reviewed  -Just turned 49.  No history of colon cancer screening. -No history of hepatitis C screening but low  risk -No history of shingles vaccine -Tetanus booster due this year -He declines COVID booster vaccine.  He did have initial 2  Family history-Father had coronary disease age 40.  Both mother and sister had breast cancer.  Mom also had history of hyperlipidemia.  Social history-non-smoker.  No regular alcohol use.  He is married with a couple of children  Past Medical History:  Diagnosis Date  . Hyperlipidemia   . HYPERLIPIDEMIA 04/01/2009  . Kidney stones   . Kidney stones   . Sleep apnea   . Sleep apnea     Past Surgical History:  Procedure Laterality Date  . TONSILLECTOMY    . TONSILLECTOMY      Family History  Problem Relation Age of Onset  . Hyperlipidemia Mother   . Cancer Mother 19       Bilateral Breast Cancer  . Alcohol abuse Father   . Hyperlipidemia Father   . Hypertension Father   . Heart disease Father 51       CAD  . Diabetes Father        grandfather  . Alcohol abuse Paternal Uncle   . Alcohol abuse Paternal Grandfather   . Cancer Other        prostate/grandfather, colon/grandmother  . Alzheimer's disease Other        grandmother  . Sudden death Other        grandfather  . Stroke Other        grandfather  .  Hypothyroidism Sister   . Cancer Sister        breast    Social History   Socioeconomic History  . Marital status: Married    Spouse name: Not on file  . Number of children: Not on file  . Years of education: Not on file  . Highest education level: Not on file  Occupational History  . Not on file  Tobacco Use  . Smoking status: Never Smoker  . Smokeless tobacco: Never Used  Vaping Use  . Vaping Use: Never used  Substance and Sexual Activity  . Alcohol use: No  . Drug use: No  . Sexual activity: Not on file  Other Topics Concern  . Not on file  Social History Narrative  . Not on file   Social Determinants of Health   Financial Resource Strain: Not on file  Food Insecurity: Not on file  Transportation Needs: Not on file   Physical Activity: Not on file  Stress: Not on file  Social Connections: Not on file  Intimate Partner Violence: Not on file    Outpatient Medications Prior to Visit  Medication Sig Dispense Refill  . apixaban (ELIQUIS) 5 MG TABS tablet Take 1 tablet (5 mg total) by mouth 2 (two) times daily. 60 tablet 5  . atorvastatin (LIPITOR) 40 MG tablet Take 1 tablet (40 mg total) by mouth daily. 90 tablet 3  . cyclobenzaprine (FLEXERIL) 10 MG tablet Take 1 tablet (10 mg total) by mouth at bedtime. 20 tablet 0  . sildenafil (VIAGRA) 100 MG tablet Take 0.5-1 tablets (50-100 mg total) by mouth daily as needed for erectile dysfunction. 6 tablet 11   No facility-administered medications prior to visit.    Allergies  Allergen Reactions  . Penicillins     As a child    ROS Review of Systems  Constitutional: Negative for activity change, appetite change and fatigue.  HENT: Positive for sore throat. Negative for congestion, ear pain and trouble swallowing.   Eyes: Negative for pain and visual disturbance.  Respiratory: Negative for cough, shortness of breath and wheezing.   Cardiovascular: Negative for chest pain and palpitations.  Gastrointestinal: Negative for abdominal distention, abdominal pain, blood in stool, constipation, diarrhea, nausea, rectal pain and vomiting.  Genitourinary: Negative for dysuria, hematuria and testicular pain.  Musculoskeletal: Negative for arthralgias and joint swelling.  Skin: Negative for rash.  Neurological: Negative for dizziness, syncope and headaches.  Hematological: Negative for adenopathy.  Psychiatric/Behavioral: Negative for confusion and dysphoric mood.      Objective:    Physical Exam Constitutional:      General: He is not in acute distress.    Appearance: He is well-developed.  HENT:     Head: Normocephalic and atraumatic.     Right Ear: External ear normal.     Left Ear: External ear normal.     Mouth/Throat:     Comments: Posterior  pharynx reveals only mild erythema.  No exudate. Eyes:     Conjunctiva/sclera: Conjunctivae normal.     Pupils: Pupils are equal, round, and reactive to light.  Neck:     Thyroid: No thyromegaly.  Cardiovascular:     Rate and Rhythm: Normal rate and regular rhythm.     Heart sounds: Normal heart sounds. No murmur heard.   Pulmonary:     Effort: No respiratory distress.     Breath sounds: No wheezing or rales.  Abdominal:     General: Bowel sounds are normal. There is no distension.  Palpations: Abdomen is soft. There is no mass.     Tenderness: There is no abdominal tenderness. There is no guarding or rebound.  Musculoskeletal:     Cervical back: Normal range of motion and neck supple.  Lymphadenopathy:     Cervical: No cervical adenopathy.  Skin:    Findings: No rash.  Neurological:     Mental Status: He is alert and oriented to person, place, and time.     Cranial Nerves: No cranial nerve deficit.     Deep Tendon Reflexes: Reflexes normal.     BP 116/82 (BP Location: Left Arm, Cuff Size: Normal)   Pulse 80   Temp 98 F (36.7 C) (Oral)   Ht 5\' 11"  (1.803 m)   Wt 273 lb 14.4 oz (124.2 kg)   SpO2 96%   BMI 38.20 kg/m  Wt Readings from Last 3 Encounters:  06/08/20 273 lb 14.4 oz (124.2 kg)  01/29/20 270 lb 6.4 oz (122.7 kg)  12/10/19 266 lb (120.7 kg)     Health Maintenance Due  Topic Date Due  . Hepatitis C Screening  Never done  . COLONOSCOPY (Pts 45-65yrs Insurance coverage will need to be confirmed)  Never done  . COVID-19 Vaccine (3 - Booster for Pfizer series) 12/31/2019    There are no preventive care reminders to display for this patient.  Lab Results  Component Value Date   TSH 2.12 12/28/2018   Lab Results  Component Value Date   WBC 7.3 11/07/2019   HGB 16.2 11/07/2019   HCT 48.3 11/07/2019   MCV 87.0 11/07/2019   PLT 224 11/07/2019   Lab Results  Component Value Date   NA 136 11/07/2019   K 4.7 11/07/2019   CO2 24 11/07/2019    GLUCOSE 95 11/07/2019   BUN 14 11/07/2019   CREATININE 0.97 11/07/2019   BILITOT 1.1 11/06/2019   ALKPHOS 54 11/06/2019   AST 20 11/06/2019   ALT 23 11/06/2019   PROT 7.1 11/06/2019   ALBUMIN 3.4 (L) 11/06/2019   CALCIUM 9.1 11/07/2019   ANIONGAP 12 11/07/2019   GFR 86.38 12/28/2018   Lab Results  Component Value Date   CHOL 139 12/13/2019   Lab Results  Component Value Date   HDL 37 (L) 12/13/2019   Lab Results  Component Value Date   LDLCALC 82 12/13/2019   Lab Results  Component Value Date   TRIG 106 12/13/2019   Lab Results  Component Value Date   CHOLHDL 3.8 12/13/2019   No results found for: HGBA1C    Assessment & Plan:   #1 physical exam We discussed the following health maintenance issues  -Needs tetanus booster and this will be given today after patient consent -Set up screening colonoscopy -Check labs including hepatitis C antibody -Discussed Shingrix vaccine he will check at pharmacy -We recommend he try to establish more consistent exercise.  He is walking some currently.  #2 history of unprovoked DVT left lower extremity with pulmonary emboli.  Positive family history of DVT in father. -We discussed hematology referral to try to give further guidance regarding chronicity of anticoagulation.  He had one-time pulmonary embolus and DVT and this was unprovoked.  Positive family history of DVT in father.  -History of high coronary calcium score of 80 which is 88th percentile.  We discussed aggressive primary prevention.  He is on high-dose Lipitor and recheck lipids today.  Would aim for LDL less than 70  #3 sore throat symptoms.  We do not have  rapid strep capability in office today.  Throat culture was sent for completeness but suspect this may be related to postnasal drainage from allergies or possibly viral Hold antibiotics and treat symptomatically until further clarified with culture since clinical suspicion relatively low for acute strep.  No  orders of the defined types were placed in this encounter.   Follow-up: No follow-ups on file.    Carolann Littler, MD

## 2020-06-08 NOTE — Patient Instructions (Signed)
I will set up colonoscopy and hematology referral  Consider Shingrix vaccine and may want to check on insurance coverage first.

## 2020-06-09 LAB — HIV ANTIBODY (ROUTINE TESTING W REFLEX): HIV 1&2 Ab, 4th Generation: NONREACTIVE

## 2020-06-09 LAB — HEPATITIS C ANTIBODY
Hepatitis C Ab: NONREACTIVE
SIGNAL TO CUT-OFF: 0.01 (ref <1.00)

## 2020-06-10 ENCOUNTER — Telehealth: Payer: Self-pay | Admitting: *Deleted

## 2020-06-10 ENCOUNTER — Telehealth: Payer: Self-pay | Admitting: Family Medicine

## 2020-06-10 LAB — CULTURE, GROUP A STREP
MICRO NUMBER:: 11727054
SPECIMEN QUALITY:: ADEQUATE

## 2020-06-10 NOTE — Telephone Encounter (Signed)
yes

## 2020-06-10 NOTE — Telephone Encounter (Signed)
Left message for patient to call back  

## 2020-06-10 NOTE — Telephone Encounter (Signed)
Per referral 06/08/20 Dr. Elease Hashimoto - called and lvm of upcoming appointments - mailed calendar with welcome packet

## 2020-06-10 NOTE — Telephone Encounter (Signed)
Pt call and want to know when he can come and get his shingles shot pt need a call back.

## 2020-06-11 NOTE — Telephone Encounter (Signed)
Spoke with the patient. He stated his insurance will not cover the shingles vaccine unless it is included ina physical. The patient just had his physical this week. Is there a way he can go ahead and get this or will he need to wait until his next physical? Patient stated that he is ok with waiting until the next physical if that is what we need to do. Please advise.

## 2020-06-11 NOTE — Telephone Encounter (Signed)
Spoke with the patient. He stated this does not have to be on the same day as the physical, just billed as part of the physical. Appointment has been made for him to gte the vaccine. Nothing further needed.

## 2020-06-11 NOTE — Telephone Encounter (Signed)
Atc unable to leave a voicemail.

## 2020-06-11 NOTE — Telephone Encounter (Signed)
I guess the issue is whether they require it to be same day as physical.  Does not seem like it should.  If not required to get on same day we can use preventative CPE code for the vaccine.

## 2020-06-12 ENCOUNTER — Other Ambulatory Visit: Payer: Self-pay

## 2020-06-12 ENCOUNTER — Ambulatory Visit (INDEPENDENT_AMBULATORY_CARE_PROVIDER_SITE_OTHER): Payer: No Typology Code available for payment source | Admitting: *Deleted

## 2020-06-12 DIAGNOSIS — Z23 Encounter for immunization: Secondary | ICD-10-CM | POA: Diagnosis not present

## 2020-06-12 NOTE — Patient Instructions (Signed)
Per orders of Dr. Elease Hashimoto, injection of Shingrix given by Westley Hummer. Patient tolerated injection well.

## 2020-07-08 ENCOUNTER — Other Ambulatory Visit: Payer: Self-pay | Admitting: Family

## 2020-07-08 DIAGNOSIS — I2694 Multiple subsegmental pulmonary emboli without acute cor pulmonale: Secondary | ICD-10-CM

## 2020-07-08 DIAGNOSIS — I824Y9 Acute embolism and thrombosis of unspecified deep veins of unspecified proximal lower extremity: Secondary | ICD-10-CM

## 2020-07-10 ENCOUNTER — Other Ambulatory Visit: Payer: Self-pay

## 2020-07-10 ENCOUNTER — Inpatient Hospital Stay: Payer: No Typology Code available for payment source | Attending: Hematology & Oncology

## 2020-07-10 ENCOUNTER — Encounter: Payer: Self-pay | Admitting: Family

## 2020-07-10 ENCOUNTER — Inpatient Hospital Stay (HOSPITAL_BASED_OUTPATIENT_CLINIC_OR_DEPARTMENT_OTHER): Payer: No Typology Code available for payment source | Admitting: Family

## 2020-07-10 VITALS — BP 130/78 | HR 123 | Resp 18 | Ht 71.0 in | Wt 279.8 lb

## 2020-07-10 DIAGNOSIS — Z86711 Personal history of pulmonary embolism: Secondary | ICD-10-CM | POA: Insufficient documentation

## 2020-07-10 DIAGNOSIS — Z8349 Family history of other endocrine, nutritional and metabolic diseases: Secondary | ICD-10-CM | POA: Insufficient documentation

## 2020-07-10 DIAGNOSIS — I824Y9 Acute embolism and thrombosis of unspecified deep veins of unspecified proximal lower extremity: Secondary | ICD-10-CM

## 2020-07-10 DIAGNOSIS — Z7901 Long term (current) use of anticoagulants: Secondary | ICD-10-CM | POA: Insufficient documentation

## 2020-07-10 DIAGNOSIS — Z803 Family history of malignant neoplasm of breast: Secondary | ICD-10-CM | POA: Diagnosis not present

## 2020-07-10 DIAGNOSIS — Z8249 Family history of ischemic heart disease and other diseases of the circulatory system: Secondary | ICD-10-CM

## 2020-07-10 DIAGNOSIS — Z79899 Other long term (current) drug therapy: Secondary | ICD-10-CM | POA: Insufficient documentation

## 2020-07-10 DIAGNOSIS — Z86718 Personal history of other venous thrombosis and embolism: Secondary | ICD-10-CM | POA: Diagnosis present

## 2020-07-10 DIAGNOSIS — Z833 Family history of diabetes mellitus: Secondary | ICD-10-CM | POA: Diagnosis not present

## 2020-07-10 DIAGNOSIS — I2694 Multiple subsegmental pulmonary emboli without acute cor pulmonale: Secondary | ICD-10-CM

## 2020-07-10 LAB — CBC WITH DIFFERENTIAL (CANCER CENTER ONLY)
Abs Immature Granulocytes: 0.01 10*3/uL (ref 0.00–0.07)
Basophils Absolute: 0.1 10*3/uL (ref 0.0–0.1)
Basophils Relative: 1 %
Eosinophils Absolute: 0.2 10*3/uL (ref 0.0–0.5)
Eosinophils Relative: 3 %
HCT: 47.2 % (ref 39.0–52.0)
Hemoglobin: 16.5 g/dL (ref 13.0–17.0)
Immature Granulocytes: 0 %
Lymphocytes Relative: 36 %
Lymphs Abs: 2.8 10*3/uL (ref 0.7–4.0)
MCH: 30 pg (ref 26.0–34.0)
MCHC: 35 g/dL (ref 30.0–36.0)
MCV: 85.8 fL (ref 80.0–100.0)
Monocytes Absolute: 0.7 10*3/uL (ref 0.1–1.0)
Monocytes Relative: 8 %
Neutro Abs: 4.1 10*3/uL (ref 1.7–7.7)
Neutrophils Relative %: 52 %
Platelet Count: 221 10*3/uL (ref 150–400)
RBC: 5.5 MIL/uL (ref 4.22–5.81)
RDW: 12.4 % (ref 11.5–15.5)
WBC Count: 7.9 10*3/uL (ref 4.0–10.5)
nRBC: 0 % (ref 0.0–0.2)

## 2020-07-10 LAB — CMP (CANCER CENTER ONLY)
ALT: 28 U/L (ref 0–44)
AST: 17 U/L (ref 15–41)
Albumin: 4.2 g/dL (ref 3.5–5.0)
Alkaline Phosphatase: 47 U/L (ref 38–126)
Anion gap: 7 (ref 5–15)
BUN: 16 mg/dL (ref 6–20)
CO2: 32 mmol/L (ref 22–32)
Calcium: 9.7 mg/dL (ref 8.9–10.3)
Chloride: 102 mmol/L (ref 98–111)
Creatinine: 1.05 mg/dL (ref 0.61–1.24)
GFR, Estimated: >60 mL/min (ref >60)
Glucose, Bld: 98 mg/dL (ref 70–99)
Potassium: 4.1 mmol/L (ref 3.5–5.1)
Sodium: 141 mmol/L (ref 135–145)
Total Bilirubin: 0.7 mg/dL (ref 0.3–1.2)
Total Protein: 7.2 g/dL (ref 6.5–8.1)

## 2020-07-10 NOTE — Progress Notes (Signed)
Hematology/Oncology Consultation   Name: Scott Mcconnell      MRN: [id number]    Location: Room/bed info not found  Date: 07/10/2020 Time:3:33 PM   REFERRING PHYSICIAN: Carolann Littler, MD  REASON FOR CONSULT: Pulmonary embolism   DIAGNOSIS: Bilateral pulmonary emboli and left lower extremity DVT  HISTORY OF PRESENT ILLNESS: Scott Mcconnell is a very pleasant 50 yo caucasian gentleman with diagnosis of acute deep vein thrombosis involving the left posterior tibial veins, and left peroneal veins and bilateral pulmonary emboli diagnosed in August 2021.  He was hospitalized and treated with heparin gtt and then transitioned to Lovenox and finally to Eliquis. He has done well on Eliquis but his insurance will no longer cover and has suggested Xarelto as the alternative.  ECHO during admission showed an EF of 55-60%.  Hyper coag panel was negative.  He has had no issue with bleeding. He does have a bruise on his left second toe. No petechiae.  He still has a little left ankle swelling. Pedal pulses are 3+.  No falls or syncope to report. No c/o pain.  His father has history of multiple thrombotic events and heart attack now on Xarelto.  He is not a smoker. No ETOH or recreational drug use.  He denies use of testosterone or other supplementation.  He states that he had injured his left ankle in spring 2021.  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 He quit when he was diagnosed with PE and DVT. He is now walking for exercise.  No history of diabetes or thyroid disease.  No personal history of cancer. Family history includes mother - breast and skin, sister - breast, maternal aunt - brain, paternal grandfather - prostate and paternal grandmother - colon.  No fever, chills, n/v, cough, rash, SOB, chest pain, palpitations, abdominal pain or change sin bowel or bladder habits.  He has occasional dizziness when he stands too quickly.  He has had surgery including tonsillectomy and 2 teeth  extractions without any complications.  He has 2 adopted children.  He has maintained a good appetite and is staying well hydrated. His weight is stable.  He works a a Government social research officer for [employer].   ROS: All other 10 point review of systems is negative.   PAST MEDICAL HISTORY:   Past Medical History:  Diagnosis Date  . Hyperlipidemia   . HYPERLIPIDEMIA 04/01/2009  . Kidney stones   . Kidney stones   . Sleep apnea   . Sleep apnea     ALLERGIES: Allergies  Allergen Reactions  . Penicillins     As a child      MEDICATIONS:  Current Outpatient Medications on File Prior to Visit  Medication Sig Dispense Refill  . apixaban (ELIQUIS) 5 MG TABS tablet Take 1 tablet (5 mg total) by mouth 2 (two) times daily. 60 tablet 5  . atorvastatin (LIPITOR) 40 MG tablet Take 1 tablet (40 mg total) by mouth daily. 90 tablet 3  . sildenafil (VIAGRA) 100 MG tablet Take 0.5-1 tablets (50-100 mg total) by mouth daily as needed for erectile dysfunction. 6 tablet 11   No current facility-administered medications on file prior to visit.     PAST SURGICAL HISTORY Past Surgical History:  Procedure Laterality Date  . TONSILLECTOMY    . TONSILLECTOMY      FAMILY HISTORY: Family History  Problem Relation Age of Onset  . Hyperlipidemia Mother   . Cancer Mother 61  Bilateral Breast Cancer  . Alcohol abuse Father   . Hyperlipidemia Father   . Hypertension Father   . Heart disease Father 37       CAD  . Diabetes Father        grandfather  . Alcohol abuse Paternal Uncle   . Alcohol abuse Paternal Grandfather   . Cancer Other        prostate/grandfather, colon/grandmother  . Alzheimer's disease Other        grandmother  . Sudden death Other        grandfather  . Stroke Other        grandfather  . Hypothyroidism Sister   . Cancer Sister        breast    SOCIAL HISTORY:  reports that he has never smoked. He has never used smokeless tobacco. He reports that he does not drink  alcohol and does not use drugs.  PERFORMANCE STATUS: The patient's performance status is 1 - Symptomatic but completely ambulatory  PHYSICAL EXAM: Most Recent Vital Signs: Blood pressure 130/78, pulse (!) 123, resp. rate 18, height 5\' 11"  (1.803 m), weight 279 lb 12.8 oz (126.9 kg), SpO2 100 %. BP 130/78 (BP Location: Left Arm, Patient Position: Sitting)   Pulse (!) 123   Resp 18   Ht 5\' 11"  (1.803 m)   Wt 279 lb 12.8 oz (126.9 kg)   SpO2 100%   BMI 39.02 kg/m   General Appearance:    Alert, cooperative, no distress, appears stated age  Head:    Normocephalic, without obvious abnormality, atraumatic  Eyes:    PERRL, conjunctiva/corneas clear, EOM's intact, fundi    benign, both eyes             Throat:   Lips, mucosa, and tongue normal; teeth and gums normal  Neck:   Supple, symmetrical, trachea midline, no adenopathy;       thyroid:  No enlargement/tenderness/nodules; no carotid   bruit or JVD  Back:     Symmetric, no curvature, ROM normal, no CVA tenderness  Lungs:     Clear to auscultation bilaterally, respirations unlabored  Chest wall:    No tenderness or deformity  Heart:    Regular rate and rhythm, S1 and S2 normal, no murmur, rub   or gallop  Abdomen:     Soft, non-tender, bowel sounds active all four quadrants,    no masses, no organomegaly        Extremities:   Extremities normal, atraumatic, no cyanosis or edema  Pulses:   2+ and symmetric all extremities  Skin:   Skin color, texture, turgor normal, no rashes or lesions  Lymph nodes:   Cervical, supraclavicular, and axillary nodes normal  Neurologic:   CNII-XII intact. Normal strength, sensation and reflexes      throughout    LABORATORY DATA:  Results for orders placed or performed in visit on 07/10/20 (from the past 48 hour(s))  CBC with Differential ([place] Only)     Status: None   Collection Time: 07/10/20  3:00 PM  Result Value Ref Range   WBC Count 7.9 4.0 - 10.5 K/uL   RBC 5.50 4.22 - 5.81  MIL/uL   Hemoglobin 16.5 13.0 - 17.0 g/dL   HCT 47.2 39.0 - 52.0 %   MCV 85.8 80.0 - 100.0 fL   MCH 30.0 26.0 - 34.0 pg   MCHC 35.0 30.0 - 36.0 g/dL   RDW 12.4 11.5 - 15.5 %   Platelet Count 221  150 - 400 K/uL   nRBC 0.0 0.0 - 0.2 %   Neutrophils Relative % 52 %   Neutro Abs 4.1 1.7 - 7.7 K/uL   Lymphocytes Relative 36 %   Lymphs Abs 2.8 0.7 - 4.0 K/uL   Monocytes Relative 8 %   Monocytes Absolute 0.7 0.1 - 1.0 K/uL   Eosinophils Relative 3 %   Eosinophils Absolute 0.2 0.0 - 0.5 K/uL   Basophils Relative 1 %   Basophils Absolute 0.1 0.0 - 0.1 K/uL   Immature Granulocytes 0 %   Abs Immature Granulocytes 0.01 0.00 - 0.07 K/uL    Comment: Performed at [facility] at [facility], [address] [place], [place] [address]  [place] ([place] only)     Status: None   Collection Time: 07/10/20  3:00 PM  Result Value Ref Range   Sodium 141 135 - 145 mmol/L   Potassium 4.1 3.5 - 5.1 mmol/L   Chloride 102 98 - 111 mmol/L   CO2 32 22 - 32 mmol/L   Glucose, Bld 98 70 - 99 mg/dL    Comment: Glucose reference range applies only to samples taken after fasting for at least 8 hours.   BUN 16 6 - 20 mg/dL   Creatinine 1.05 0.61 - 1.24 mg/dL   Calcium 9.7 8.9 - 10.3 mg/dL   Total Protein 7.2 6.5 - 8.1 g/dL   Albumin 4.2 3.5 - 5.0 g/dL   AST 17 15 - 41 U/L   ALT 28 0 - 44 U/L   Alkaline Phosphatase 47 38 - 126 U/L   Total Bilirubin 0.7 0.3 - 1.2 mg/dL   GFR, Estimated >60 >60 mL/min    Comment: (NOTE) Calculated using the CKD-EPI Creatinine Equation (2021)    Anion gap 7 5 - 15    Comment: Performed at [facility] at [facility], [address], [place], [place] [address]      RADIOGRAPHY: No results found.     PATHOLOGY: None  ASSESSMENT/PLAN: Mr. Enderson is a very pleasant 50 yo caucasian gentleman with diagnosis of acute deep vein thrombosis involving the left posterior tibial veins, and left peroneal veins and bilateral  pulmonary emboli diagnosed in August 2021.  He has tolerated Eliquis nicely but we will go ahead and switch him to Xarelto as this is covered by his insurance.  We will also get repeat CT angio and left lower extremity US to assess his response to treatment next week.  Hyper coag panel was negative.  We will have him complete one year of full dose anticoagulation (August 2022) and then transition to maintenance Xarelto 10 mg PO daily for one year.   Follow-up in August.   All questions were answered. The patient knows to call the clinic with any problems, questions or concerns. We can certainly see the patient much sooner if necessary.  The patient was discussed with Dr. Marin Olp and he is in agreement with the aforementioned.   Laverna Peace, NP

## 2020-07-13 ENCOUNTER — Telehealth: Payer: Self-pay | Admitting: *Deleted

## 2020-07-13 MED ORDER — RIVAROXABAN 20 MG PO TABS: 20.0000 mg | ORAL_TABLET | Freq: Every day | ORAL | 3 refills | Status: DC

## 2020-07-13 NOTE — Telephone Encounter (Signed)
No 07/10/20 los

## 2020-07-16 ENCOUNTER — Telehealth: Payer: Self-pay

## 2020-07-16 NOTE — Telephone Encounter (Signed)
Pt called in to check on appts from 5/6 as no los was sent.  Looks like pt has scans that are being approved and he was advised that he will get a call with appts from the imaging center.  We did make his f/u appts as well   Scott Mcconnell

## 2020-08-21 ENCOUNTER — Ambulatory Visit: Payer: No Typology Code available for payment source

## 2020-08-25 ENCOUNTER — Telehealth: Payer: Self-pay | Admitting: *Deleted

## 2020-08-25 NOTE — Telephone Encounter (Signed)
Called and spoke to patient about rescheduling appointment from 10/16/20 to 10/20/20. Patient will be calling back to confirm his appointment after he checks his calendar at home.

## 2020-08-31 ENCOUNTER — Other Ambulatory Visit (HOSPITAL_BASED_OUTPATIENT_CLINIC_OR_DEPARTMENT_OTHER): Payer: No Typology Code available for payment source

## 2020-09-01 ENCOUNTER — Encounter (HOSPITAL_BASED_OUTPATIENT_CLINIC_OR_DEPARTMENT_OTHER): Payer: Self-pay

## 2020-09-01 ENCOUNTER — Ambulatory Visit (HOSPITAL_BASED_OUTPATIENT_CLINIC_OR_DEPARTMENT_OTHER)
Admission: RE | Admit: 2020-09-01 | Discharge: 2020-09-01 | Disposition: A | Discharge status: Home / Self Care | Payer: No Typology Code available for payment source | Source: Ambulatory Visit | Attending: Family | Admitting: Family

## 2020-09-01 ENCOUNTER — Other Ambulatory Visit: Payer: Self-pay

## 2020-09-01 DIAGNOSIS — I2694 Multiple subsegmental pulmonary emboli without acute cor pulmonale: Secondary | ICD-10-CM

## 2020-09-01 DIAGNOSIS — I824Y9 Acute embolism and thrombosis of unspecified deep veins of unspecified proximal lower extremity: Secondary | ICD-10-CM | POA: Insufficient documentation

## 2020-09-01 MED ORDER — IOHEXOL 350 MG/ML SOLN
100.0000 mL | Freq: Once | INTRAVENOUS | Status: AC | PRN
  Administered 2020-09-01: 100 mL via INTRAVENOUS

## 2020-09-02 ENCOUNTER — Encounter: Payer: Self-pay | Admitting: *Deleted

## 2020-09-04 ENCOUNTER — Other Ambulatory Visit: Payer: Self-pay

## 2020-09-04 ENCOUNTER — Ambulatory Visit (INDEPENDENT_AMBULATORY_CARE_PROVIDER_SITE_OTHER): Payer: No Typology Code available for payment source

## 2020-09-04 DIAGNOSIS — Z23 Encounter for immunization: Secondary | ICD-10-CM

## 2020-09-14 NOTE — Progress Notes (Signed)
Error

## 2020-09-24 ENCOUNTER — Telehealth: Payer: Self-pay

## 2020-10-16 ENCOUNTER — Ambulatory Visit: Payer: No Typology Code available for payment source | Admitting: Family

## 2020-10-16 ENCOUNTER — Other Ambulatory Visit: Payer: No Typology Code available for payment source

## 2020-10-20 ENCOUNTER — Inpatient Hospital Stay: Payer: No Typology Code available for payment source | Attending: Hematology & Oncology | Admitting: Family

## 2020-10-20 ENCOUNTER — Other Ambulatory Visit: Payer: No Typology Code available for payment source

## 2020-10-20 ENCOUNTER — Encounter: Payer: Self-pay | Admitting: Family

## 2020-10-20 ENCOUNTER — Other Ambulatory Visit: Payer: Self-pay

## 2020-10-20 VITALS — BP 106/68 | HR 72 | Temp 98.3°F | Resp 17 | Wt 280.0 lb

## 2020-10-20 DIAGNOSIS — I824Y9 Acute embolism and thrombosis of unspecified deep veins of unspecified proximal lower extremity: Secondary | ICD-10-CM | POA: Diagnosis not present

## 2020-10-20 DIAGNOSIS — Z86711 Personal history of pulmonary embolism: Secondary | ICD-10-CM | POA: Insufficient documentation

## 2020-10-20 DIAGNOSIS — Z86718 Personal history of other venous thrombosis and embolism: Secondary | ICD-10-CM | POA: Diagnosis present

## 2020-10-20 DIAGNOSIS — Z7901 Long term (current) use of anticoagulants: Secondary | ICD-10-CM | POA: Diagnosis not present

## 2020-10-20 DIAGNOSIS — I2694 Multiple subsegmental pulmonary emboli without acute cor pulmonale: Secondary | ICD-10-CM | POA: Diagnosis not present

## 2020-10-20 NOTE — Progress Notes (Signed)
Hematology and Oncology Follow Up Visit  Scott Mcconnell GP:[id number] 05/15/1970 50 y.o. 10/20/2020   Principle Diagnosis:  Bilateral pulmonary emboli and left lower extremity DVT  Current Therapy: Xarelto 20 mg PO daily   Interim History:  Scott Mcconnell is here today for follow-up. He is doing well and has tolerated Xarelto 20 mg PO  daily nicely.  No blood loss, abnormal bruising or petechiae.  His CT angio in June showed showed resolution of PEs and left lower extremity US showed residual nonocclusive left calf posterior tibial thrombus.  No pain, swelling, tenderness, numbness or tingling in his extremities at this time.  He has a job where he sits a good deal of the time but makes and effort to get up and walk around as much as he can to stimulate blood flow.  No fever, chills, n/v, cough, rash, chest pain, palpitations, abdominal pain or changes in bowel or bladder habits.  He has occasional lightheadedness when he stands too quickly and is careful when getting up.  He is starting to work out again and notes mild SOB with over exertion. He takes a break to rest when needed.  No falls or syncope to report.  He has maintained a good appetite and is staying well hydrated. His weight is stable at 280 lbs.   ECOG Performance Status: 1 - Symptomatic but completely ambulatory  Medications:  Allergies as of 10/20/2020       Reactions   Penicillins    As a child        Medication List        Accurate as of October 20, 2020 12:56 PM. If you have any questions, ask your nurse or doctor.          atorvastatin 40 MG tablet Commonly known as: LIPITOR Take 1 tablet (40 mg total) by mouth daily.   rivaroxaban 20 MG Tabs tablet Commonly known as: XARELTO Take 1 tablet (20 mg total) by mouth daily with supper.   sildenafil 100 MG tablet Commonly known as: Viagra Take 0.5-1 tablets (50-100 mg total) by mouth daily as needed for erectile dysfunction.        Allergies:   Allergies  Allergen Reactions   Penicillins     As a child    Past Medical History, Surgical history, Social history, and Family History were reviewed and updated.  Review of Systems: All other 10 point review of systems is negative.   Physical Exam:  weight is 280 lb 0.6 oz (127 kg). His oral temperature is 98.3 F (36.8 C). His blood pressure is 106/68 and his pulse is 72. His respiration is 17 and oxygen saturation is 97%.   Wt Readings from Last 3 Encounters:  10/20/20 280 lb 0.6 oz (127 kg)  07/10/20 279 lb 12.8 oz (126.9 kg)  06/08/20 273 lb 14.4 oz (124.2 kg)    Ocular: Sclerae unicteric, pupils equal, round and reactive to light Ear-nose-throat: Oropharynx clear, dentition fair Lymphatic: No cervical or supraclavicular adenopathy Lungs no rales or rhonchi, good excursion bilaterally Heart regular rate and rhythm, no murmur appreciated Abd soft, nontender, positive bowel sounds MSK no focal spinal tenderness, no joint edema Neuro: non-focal, well-oriented, appropriate affect Breasts: Deferred   Lab Results  Component Value Date   WBC 7.9 07/10/2020   HGB 16.5 07/10/2020   HCT 47.2 07/10/2020   MCV 85.8 07/10/2020   PLT 221 07/10/2020   No results found for: FERRITIN, IRON, TIBC, UIBC, IRONPCTSAT Lab Results  Component  Value Date   RBC 5.50 07/10/2020   No results found for: KPAFRELGTCHN, LAMBDASER, KAPLAMBRATIO No results found for: IGGSERUM, IGA, IGMSERUM No results found for: Ronnald Ramp, A1GS, A2GS, Violet Baldy, MSPIKE, SPEI   Chemistry      Component Value Date/Time   NA 141 07/10/2020 1500   K 4.1 07/10/2020 1500   CL 102 07/10/2020 1500   CO2 32 07/10/2020 1500   BUN 16 07/10/2020 1500   CREATININE 1.05 07/10/2020 1500      Component Value Date/Time   CALCIUM 9.7 07/10/2020 1500   ALKPHOS 47 07/10/2020 1500   AST 17 07/10/2020 1500   ALT 28 07/10/2020 1500   BILITOT 0.7 07/10/2020 1500       Impression and Plan:  Scott Mcconnell is a very pleasant 50 yo caucasian gentleman with diagnosis of acute deep vein thrombosis involving the left posterior tibial veins, and left peroneal veins and bilateral pulmonary emboli diagnosed in August 2021.  He is doing well on XArelto and has had a nice response so far.  We will plan to see him again in another 6 month.  Due to cost he is going to contact his insurance company to see where their preferred lab and radiology department are nd we will give him written orders for lab and repeat US in 6 months as well. He will have these done 2 weeks prior to his follow-up.  He can contact our office with any questions or concerns.   Laverna Peace, NP 8/16/202212:56 PM

## 2020-11-02 ENCOUNTER — Other Ambulatory Visit: Payer: Self-pay | Admitting: Family Medicine

## 2020-11-19 ENCOUNTER — Other Ambulatory Visit: Payer: Self-pay | Admitting: Family

## 2020-11-19 DIAGNOSIS — I824Y9 Acute embolism and thrombosis of unspecified deep veins of unspecified proximal lower extremity: Secondary | ICD-10-CM

## 2020-11-19 DIAGNOSIS — I2694 Multiple subsegmental pulmonary emboli without acute cor pulmonale: Secondary | ICD-10-CM

## 2020-12-25 ENCOUNTER — Telehealth: Payer: Self-pay | Admitting: *Deleted

## 2020-12-25 NOTE — Telephone Encounter (Signed)
Call received from patient stating that he would like assistance with his facility charge bill of $35.00.  Patient claims that he was told by this office that the bill for the facility charge would be waived.  Informed pt that this office is not able to waive facility charges.  Pt extremely upset with that answer and requests that all further appts at this office be canceled.  Appts canceled per pt.'s request.

## 2020-12-28 ENCOUNTER — Other Ambulatory Visit: Payer: Self-pay | Admitting: Cardiology

## 2021-01-15 ENCOUNTER — Ambulatory Visit (INDEPENDENT_AMBULATORY_CARE_PROVIDER_SITE_OTHER): Payer: No Typology Code available for payment source

## 2021-01-15 DIAGNOSIS — Z23 Encounter for immunization: Secondary | ICD-10-CM | POA: Diagnosis not present

## 2021-03-27 ENCOUNTER — Other Ambulatory Visit: Payer: Self-pay | Admitting: Cardiology

## 2021-04-14 ENCOUNTER — Other Ambulatory Visit: Payer: Self-pay | Admitting: Hematology & Oncology

## 2021-04-14 DIAGNOSIS — I824Y9 Acute embolism and thrombosis of unspecified deep veins of unspecified proximal lower extremity: Secondary | ICD-10-CM

## 2021-04-14 DIAGNOSIS — I2694 Multiple subsegmental pulmonary emboli without acute cor pulmonale: Secondary | ICD-10-CM

## 2021-04-21 ENCOUNTER — Other Ambulatory Visit: Payer: Self-pay | Admitting: Cardiology

## 2021-04-23 ENCOUNTER — Ambulatory Visit: Payer: No Typology Code available for payment source | Admitting: Family

## 2021-05-17 ENCOUNTER — Other Ambulatory Visit: Payer: Self-pay | Admitting: Cardiology

## 2021-06-09 ENCOUNTER — Ambulatory Visit (INDEPENDENT_AMBULATORY_CARE_PROVIDER_SITE_OTHER): Payer: No Typology Code available for payment source | Admitting: Family Medicine

## 2021-06-09 ENCOUNTER — Encounter: Payer: Self-pay | Admitting: Family Medicine

## 2021-06-09 VITALS — BP 122/80 | HR 88 | Temp 98.0°F | Ht 71.0 in | Wt 284.3 lb

## 2021-06-09 DIAGNOSIS — Z0000 Encounter for general adult medical examination without abnormal findings: Secondary | ICD-10-CM

## 2021-06-09 DIAGNOSIS — G4733 Obstructive sleep apnea (adult) (pediatric): Secondary | ICD-10-CM | POA: Diagnosis not present

## 2021-06-09 DIAGNOSIS — Z1211 Encounter for screening for malignant neoplasm of colon: Secondary | ICD-10-CM

## 2021-06-09 LAB — CBC WITH DIFFERENTIAL/PLATELET
Basophils Absolute: 0 10*3/uL (ref 0.0–0.1)
Basophils Relative: 0.6 % (ref 0.0–3.0)
Eosinophils Absolute: 0.1 10*3/uL (ref 0.0–0.7)
Eosinophils Relative: 2.4 % (ref 0.0–5.0)
HCT: 47.7 % (ref 39.0–52.0)
Hemoglobin: 16.4 g/dL (ref 13.0–17.0)
Lymphocytes Relative: 44 % (ref 12.0–46.0)
Lymphs Abs: 2.6 10*3/uL (ref 0.7–4.0)
MCHC: 34.5 g/dL (ref 30.0–36.0)
MCV: 86.2 fl (ref 78.0–100.0)
Monocytes Absolute: 0.5 10*3/uL (ref 0.1–1.0)
Monocytes Relative: 8.1 % (ref 3.0–12.0)
Neutro Abs: 2.6 10*3/uL (ref 1.4–7.7)
Neutrophils Relative %: 44.9 % (ref 43.0–77.0)
Platelets: 221 10*3/uL (ref 150.0–400.0)
RBC: 5.54 Mil/uL (ref 4.22–5.81)
RDW: 13.2 % (ref 11.5–15.5)
WBC: 5.8 10*3/uL (ref 4.0–10.5)

## 2021-06-09 LAB — HEPATIC FUNCTION PANEL
ALT: 32 U/L (ref 0–53)
AST: 23 U/L (ref 0–37)
Albumin: 4.3 g/dL (ref 3.5–5.2)
Alkaline Phosphatase: 49 U/L (ref 39–117)
Bilirubin, Direct: 0.2 mg/dL (ref 0.0–0.3)
Total Bilirubin: 1.3 mg/dL — ABNORMAL HIGH (ref 0.2–1.2)
Total Protein: 7.1 g/dL (ref 6.0–8.3)

## 2021-06-09 LAB — LIPID PANEL
Cholesterol: 142 mg/dL (ref 0–200)
HDL: 31.8 mg/dL — ABNORMAL LOW (ref >39.00)
LDL Cholesterol: 87 mg/dL (ref 0–99)
NonHDL: 110.47
Total CHOL/HDL Ratio: 4
Triglycerides: 117 mg/dL (ref 0.0–149.0)
VLDL: 23.4 mg/dL (ref 0.0–40.0)

## 2021-06-09 LAB — BASIC METABOLIC PANEL
BUN: 13 mg/dL (ref 6–23)
CO2: 30 mEq/L (ref 19–32)
Calcium: 9.3 mg/dL (ref 8.4–10.5)
Chloride: 102 mEq/L (ref 96–112)
Creatinine, Ser: 0.94 mg/dL (ref 0.40–1.50)
GFR: 93.94 mL/min (ref >60.00)
Glucose, Bld: 104 mg/dL — ABNORMAL HIGH (ref 70–99)
Potassium: 4.4 mEq/L (ref 3.5–5.1)
Sodium: 139 mEq/L (ref 135–145)

## 2021-06-09 LAB — PSA: PSA: 0.84 ng/mL (ref 0.10–4.00)

## 2021-06-09 LAB — TSH: TSH: 2.27 u[IU]/mL (ref 0.35–5.50)

## 2021-06-09 MED ORDER — ATORVASTATIN CALCIUM 40 MG PO TABS: 40.0000 mg | ORAL_TABLET | Freq: Every day | ORAL | 3 refills | Status: DC

## 2021-06-09 NOTE — Progress Notes (Signed)
? ?Established Patient Office Visit ? ?Subjective:  ?Patient ID: Scott Mcconnell, male    DOB: 05/14/1970  Age: 51 y.o. MRN: [id number] ? ?CC:  ?Chief Complaint  ?Patient presents with  ? Annual Exam  ? ? ?HPI ?Scott Mcconnell presents for annual physical exam.  His medical problems include history of pulmonary embolus, obstructive sleep apnea, obesity, hyperlipidemia.  Previous coronary calcium score of 80 which is 88th percentile.  He is on atorvastatin 40 mg daily.  Is exercising some and no chest pain with exercise.  Does have occasional shortness of breath after exercise and wonders if there may be some reactive airway component.  No associated cough. ? ?He does have history of pulmonary embolus and saw hematologist and this was unprovoked pulmonary emboli with positive family history mother and father.  He remains on Xarelto 20 mg daily. ? ?Has obstructive sleep apnea.  Has not had sleep study in years.  He uses CPAP regularly with good benefit.  No major daytime somnolence.  His wife has noted recently that he is snoring even with the CPAP.  He would like to consider repeat home study. ? ?Family history reviewed with no significant changes. ? ?Social history-married with 2 children.  He has 1 child graduated from [employer] this year.  He is non-smoker. ? ?Health maintenance reviewed ? ?-Up-to-date with exception of no history of colonoscopy screening.  He would like to proceed with that ? ?Past Medical History:  ?Diagnosis Date  ? Hyperlipidemia   ? HYPERLIPIDEMIA 04/01/2009  ? Kidney stones   ? Kidney stones   ? Sleep apnea   ? Sleep apnea   ? ? ?Past Surgical History:  ?Procedure Laterality Date  ? TONSILLECTOMY    ? TONSILLECTOMY    ? ? ?Family History  ?Problem Relation Age of Onset  ? Hyperlipidemia Mother   ? Cancer Mother 15  ?     Bilateral Breast Cancer  ? Alcohol abuse Father   ? Hyperlipidemia Father   ? Hypertension Father   ? Heart disease Father 56  ?     CAD  ? Diabetes Father   ?     grandfather   ? Alcohol abuse Paternal Uncle   ? Alcohol abuse Paternal Grandfather   ? Cancer Other   ?     prostate/grandfather, colon/grandmother  ? Alzheimer's disease Other   ?     grandmother  ? Sudden death Other   ?     grandfather  ? Stroke Other   ?     grandfather  ? Hypothyroidism Sister   ? Cancer Sister   ?     breast  ? ? ?Social History  ? ?Socioeconomic History  ? Marital status: Married  ?  Spouse name: Not on file  ? Number of children: Not on file  ? Years of education: Not on file  ? Highest education level: Not on file  ?Occupational History  ? Not on file  ?Tobacco Use  ? Smoking status: Never  ? Smokeless tobacco: Never  ?Vaping Use  ? Vaping Use: Never used  ?Substance and Sexual Activity  ? Alcohol use: No  ? Drug use: No  ? Sexual activity: Not on file  ?Other Topics Concern  ? Not on file  ?Social History Narrative  ? Not on file  ? ?Social Determinants of Health  ? ?Financial Resource Strain: Not on file  ?Food Insecurity: Not on file  ?Transportation Needs: Not on file  ?  Physical Activity: Not on file  ?Stress: Not on file  ?Social Connections: Not on file  ?Intimate Partner Violence: Not on file  ? ? ?Outpatient Medications Prior to Visit  ?Medication Sig Dispense Refill  ? sildenafil (VIAGRA) 100 MG tablet TAKE 0.5-1 TABLETS (50-100 MG TOTAL) BY MOUTH DAILY AS NEEDED FOR ERECTILE DYSFUNCTION. 6 tablet 11  ? XARELTO 20 MG TABS tablet TAKE 1 TABLET BY MOUTH DAILY WITH SUPPER 30 tablet 3  ? atorvastatin (LIPITOR) 40 MG tablet TAKE 1 TABLET BY MOUTH EVERY DAY PLEASE SCHEDULE APPT W/DR [facility] FOR FURTHER REFILLS 30 tablet 0  ? ?No facility-administered medications prior to visit.  ? ? ?Allergies  ?Allergen Reactions  ? Penicillins   ?  As a child  ? ? ?ROS ?Review of Systems  ?Constitutional:  Negative for activity change, appetite change, fatigue and fever.  ?HENT:  Negative for congestion, ear pain and trouble swallowing.   ?Eyes:  Negative for pain and visual disturbance.  ?Respiratory:  Negative  for cough, shortness of breath and wheezing.   ?Cardiovascular:  Negative for chest pain and palpitations.  ?Gastrointestinal:  Negative for abdominal distention, abdominal pain, blood in stool, constipation, diarrhea, nausea, rectal pain and vomiting.  ?Genitourinary:  Negative for dysuria, hematuria and testicular pain.  ?Musculoskeletal:  Negative for arthralgias and joint swelling.  ?Skin:  Negative for rash.  ?Neurological:  Negative for dizziness, syncope and headaches.  ?Hematological:  Negative for adenopathy.  ?Psychiatric/Behavioral:  Negative for confusion and dysphoric mood.   ? ?  ?Objective:  ?  ?Physical Exam ?Constitutional:   ?   General: He is not in acute distress. ?   Appearance: He is well-developed.  ?HENT:  ?   Head: Normocephalic and atraumatic.  ?   Right Ear: External ear normal.  ?   Left Ear: External ear normal.  ?Eyes:  ?   Conjunctiva/sclera: Conjunctivae normal.  ?   Pupils: Pupils are equal, round, and reactive to light.  ?Neck:  ?   Thyroid: No thyromegaly.  ?Cardiovascular:  ?   Rate and Rhythm: Normal rate and regular rhythm.  ?   Heart sounds: Normal heart sounds. No murmur heard. ?Pulmonary:  ?   Effort: No respiratory distress.  ?   Breath sounds: No wheezing or rales.  ?Abdominal:  ?   General: Bowel sounds are normal. There is no distension.  ?   Palpations: Abdomen is soft.  ?   Tenderness: There is no abdominal tenderness. There is no guarding or rebound.  ?   Comments: Has umbilical hernia which is soft and nontender.  Probably has small ventral hernia as well.  ?Musculoskeletal:  ?   Cervical back: Normal range of motion and neck supple.  ?Lymphadenopathy:  ?   Cervical: No cervical adenopathy.  ?Skin: ?   Findings: No rash.  ?Neurological:  ?   Mental Status: He is alert and oriented to person, place, and time.  ?   Cranial Nerves: No cranial nerve deficit.  ? ? ?BP 122/80 (BP Location: Left Arm, Patient Position: Sitting, Cuff Size: Normal)   Pulse 88   Temp 98 ?F  (36.7 ?C) (Oral)   Ht '5\' 11"'$  (1.803 m)   Wt 284 lb 4.8 oz (129 kg)   SpO2 94%   BMI 39.65 kg/m?  ?Wt Readings from Last 3 Encounters:  ?06/09/21 284 lb 4.8 oz (129 kg)  ?10/20/20 280 lb 0.6 oz (127 kg)  ?07/10/20 279 lb 12.8 oz (126.9 kg)  ? ? ? ?  Health Maintenance Due  ?Topic Date Due  ? COLONOSCOPY (Pts 45-59yr Insurance coverage will need to be confirmed)  Never done  ? COVID-19 Vaccine (3 - Pfizer risk series) 07/29/2019  ? ? ?There are no preventive care reminders to display for this patient. ? ?Lab Results  ?Component Value Date  ? TSH 3.18 06/08/2020  ? ?Lab Results  ?Component Value Date  ? WBC 7.9 07/10/2020  ? HGB 16.5 07/10/2020  ? HCT 47.2 07/10/2020  ? MCV 85.8 07/10/2020  ? PLT 221 07/10/2020  ? ?Lab Results  ?Component Value Date  ? NA 141 07/10/2020  ? K 4.1 07/10/2020  ? CO2 32 07/10/2020  ? GLUCOSE 98 07/10/2020  ? BUN 16 07/10/2020  ? CREATININE 1.05 07/10/2020  ? BILITOT 0.7 07/10/2020  ? ALKPHOS 47 07/10/2020  ? AST 17 07/10/2020  ? ALT 28 07/10/2020  ? PROT 7.2 07/10/2020  ? ALBUMIN 4.2 07/10/2020  ? CALCIUM 9.7 07/10/2020  ? ANIONGAP 7 07/10/2020  ? GFR 101.77 06/08/2020  ? ?Lab Results  ?Component Value Date  ? CHOL 137 06/08/2020  ? ?Lab Results  ?Component Value Date  ? HDL 34.30 (L) 06/08/2020  ? ?Lab Results  ?Component Value Date  ? [place]71 06/08/2020  ? ?Lab Results  ?Component Value Date  ? TRIG 157.0 (H) 06/08/2020  ? ?Lab Results  ?Component Value Date  ? CHOLHDL 4 06/08/2020  ? ?No results found for: HGBA1C ? ?  ?Assessment & Plan:  ? ?Problem List Items Addressed This Visit   ?None ?Visit Diagnoses   ? ? Physical exam    -  Primary  ? Relevant Orders  ? Basic metabolic panel  ? Lipid panel  ? CBC with Differential/Platelet  ? TSH  ? Hepatic function panel  ? PSA  ? Colon cancer screening      ? Relevant Orders  ? Ambulatory referral to Gastroenterology  ? ?  ?-Set up referral for colonoscopy ?-Obtain screening labs as above ?-Refill atorvastatin for 1 year ?-Consider  setting up home sleep study to reevaluate his sleep apnea ? ?Meds ordered this encounter  ?Medications  ? atorvastatin (LIPITOR) 40 MG tablet  ?  Sig: Take 1 tablet (40 mg total) by mouth daily.  ?  Dispense:  90 tab

## 2021-06-25 IMAGING — CT CT CARDIAC CORONARY ARTERY CALCIUM SCORE
3 series · 14 of 20 positions shown, 15 images · non-contrast
Comparison: 11/04/2019
COMPARISON: 11/04/2019

Addendum:
EXAM:
OVER-READ INTERPRETATION  CT CHEST

The following report is an over-read performed by radiologist Dr.
Elbin Chou [REDACTED] on 12/24/2019. This
over-read does not include interpretation of cardiac or coronary
anatomy or pathology. The coronary CTA interpretation by the
cardiologist is attached.
CLINICAL DATA: Risk stratification
Coronary Calcium Score
TECHNIQUE: The patient was scanned on a Siemens Force scanner. Axial
non-contrast 3 mm slices were carried out through the heart. The
data set was analyzed on a dedicated work station and scored using
the Agatson method.

[Series 2: casc 3.0 bv41 2 bestdiast 73 % · axial · 0.39mm/px · z∈[+1188,+1260]mm · 4 of 42 slices shown, 5 images]
[im 9/42  vessel]
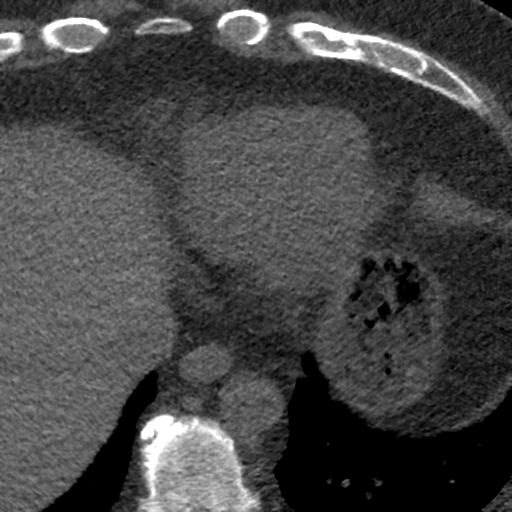
[im 9/42  lung]
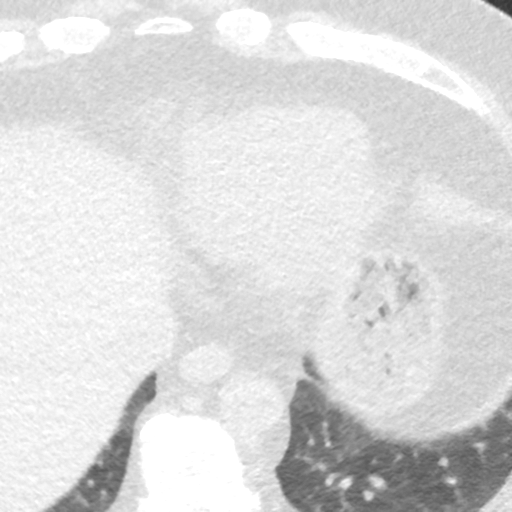
[im 17/42  vessel]
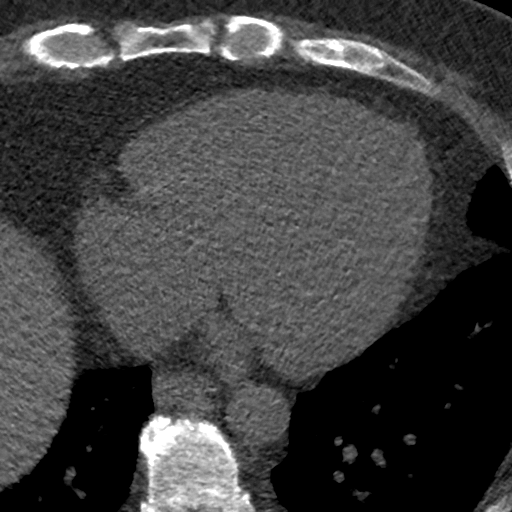
[im 25/42  vessel]
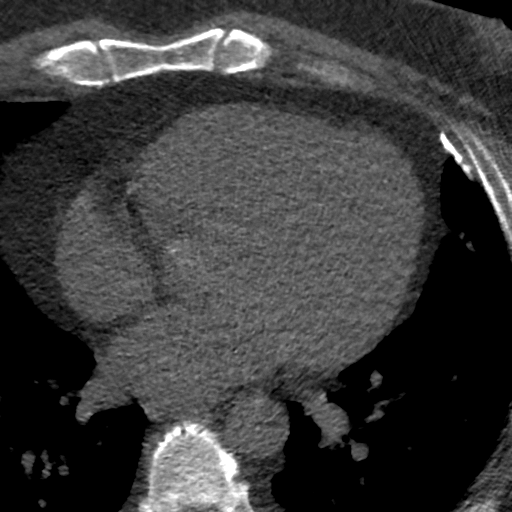
[im 33/42  vessel]
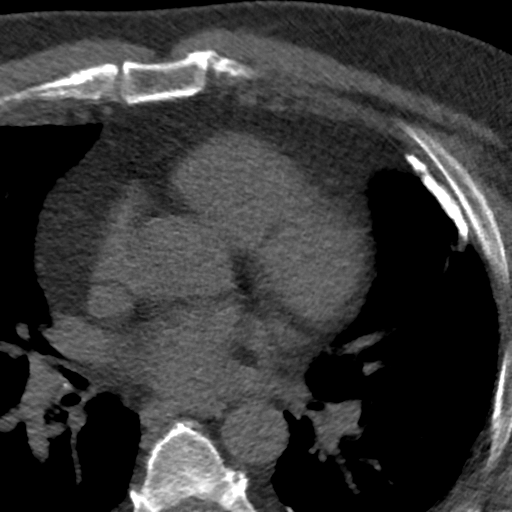

[Series 3: lung 68 % · axial · 0.73mm/px · z∈[+1182,+1266]mm · 5 of 42 slices shown]
[im 7/42  lung]
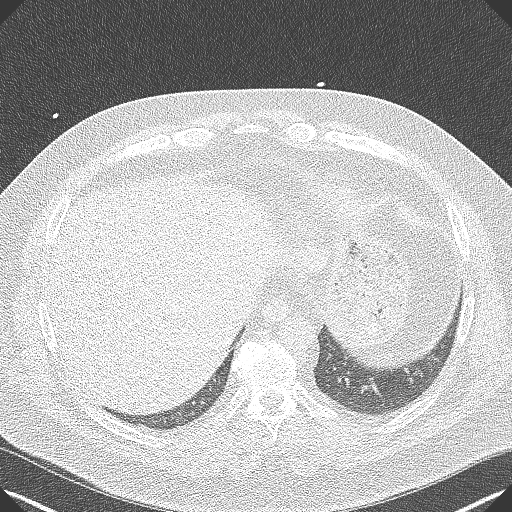
[im 14/42  lung]
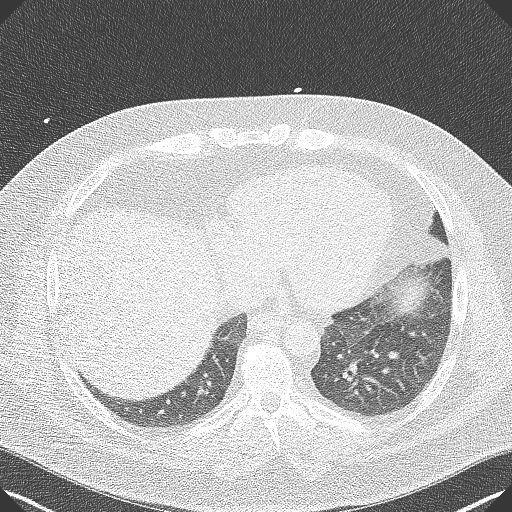
[im 21/42  lung]
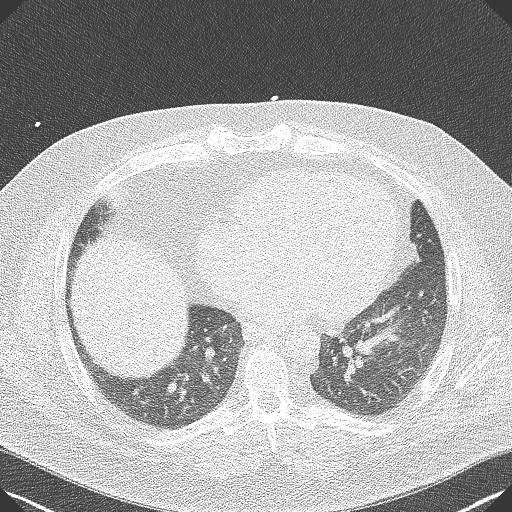
[im 28/42  lung]
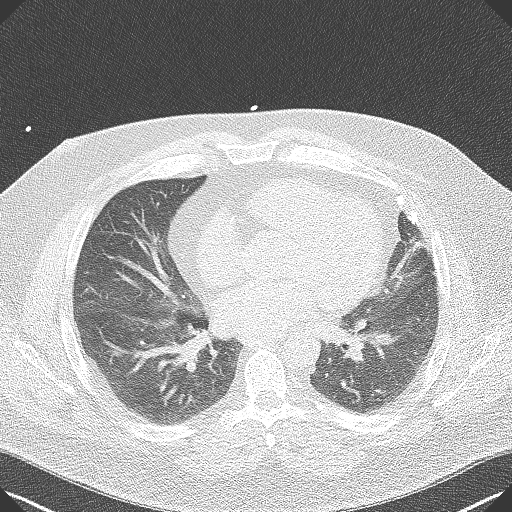
[im 35/42  lung]
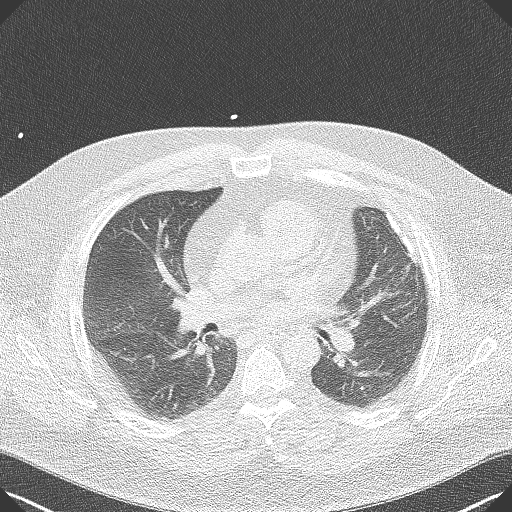

[Series 4: lung st 68 % · axial · 0.73mm/px · z∈[+1182,+1266]mm · 5 of 42 slices shown]
[im 7/42  lung]
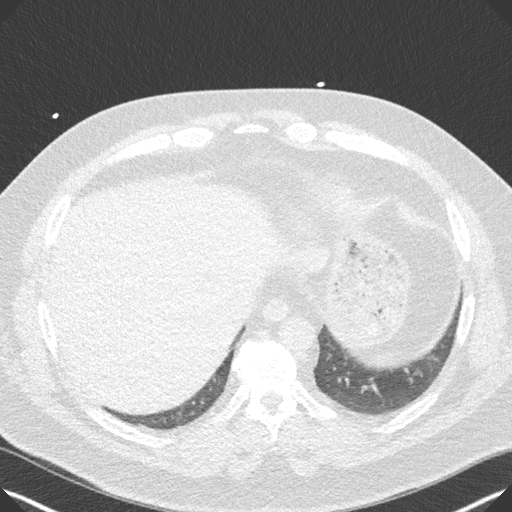
[im 14/42  lung]
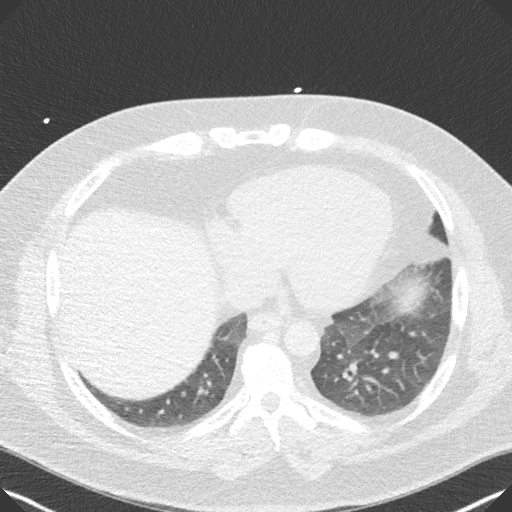
[im 21/42  lung]
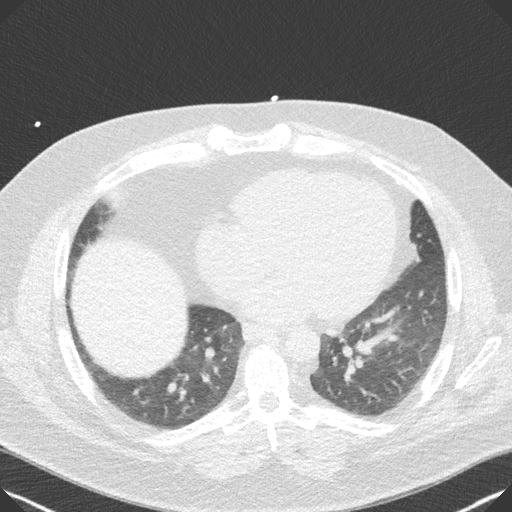
[im 28/42  lung]
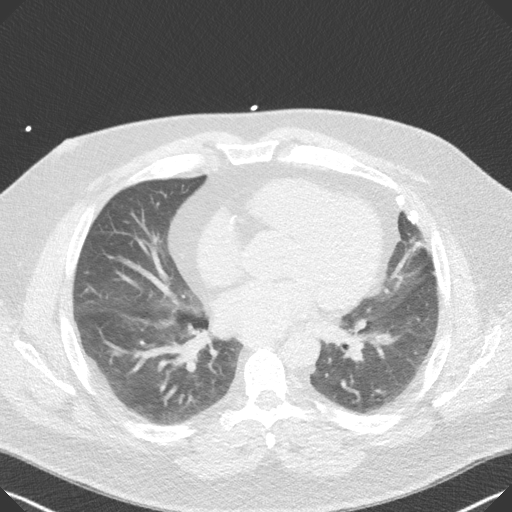
[im 35/42  lung]
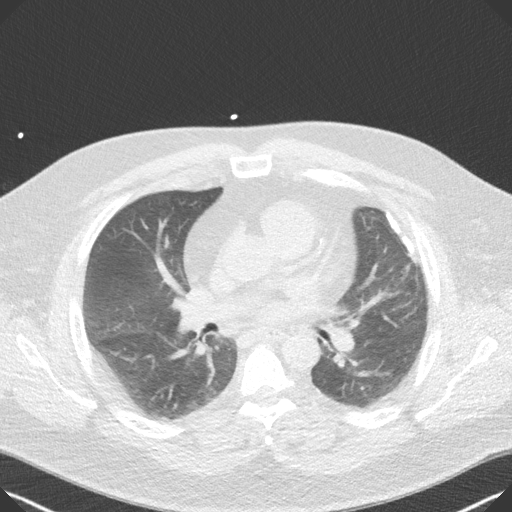

[14 of 20 positions shown; findings below may reference images not displayed]

FINDINGS: Vascular: Heart is normal size.  Aorta normal caliber.

Mediastinum/Nodes: No adenopathy

Lungs/Pleura: Scarring in the inferior lingula.  No effusions.

Upper Abdomen: Imaging into the upper abdomen demonstrates no acute
findings.

Musculoskeletal: Mild bilateral gynecomastia. No acute bony
abnormality.
IMPRESSION: No acute extra cardiac abnormality.
FINDINGS: Non-cardiac: See separate report from [REDACTED].

Ascending Aorta: Normal size

Pericardium: Normal

Coronary arteries: Calcium score 80
IMPRESSION: Coronary calcium score of 80. This was percentile 88 for age and sex
matched control.

*** End of Addendum ***
EXAM:
OVER-READ INTERPRETATION  CT CHEST

The following report is an over-read performed by radiologist Dr.
Elbin Chou [REDACTED] on 12/24/2019. This
over-read does not include interpretation of cardiac or coronary
anatomy or pathology. The coronary CTA interpretation by the
cardiologist is attached.
FINDINGS: Vascular: Heart is normal size.  Aorta normal caliber.

Mediastinum/Nodes: No adenopathy

Lungs/Pleura: Scarring in the inferior lingula.  No effusions.

Upper Abdomen: Imaging into the upper abdomen demonstrates no acute
findings.

Musculoskeletal: Mild bilateral gynecomastia. No acute bony
abnormality.
IMPRESSION: No acute extra cardiac abnormality.

## 2021-07-14 ENCOUNTER — Encounter: Payer: Self-pay | Admitting: Family Medicine

## 2021-07-14 DIAGNOSIS — Z1211 Encounter for screening for malignant neoplasm of colon: Secondary | ICD-10-CM

## 2021-08-06 ENCOUNTER — Other Ambulatory Visit: Payer: Self-pay | Admitting: Hematology & Oncology

## 2021-08-06 DIAGNOSIS — I824Y9 Acute embolism and thrombosis of unspecified deep veins of unspecified proximal lower extremity: Secondary | ICD-10-CM

## 2021-08-06 DIAGNOSIS — I2694 Multiple subsegmental pulmonary emboli without acute cor pulmonale: Secondary | ICD-10-CM

## 2021-08-09 ENCOUNTER — Other Ambulatory Visit: Payer: Self-pay

## 2021-08-09 DIAGNOSIS — I824Y9 Acute embolism and thrombosis of unspecified deep veins of unspecified proximal lower extremity: Secondary | ICD-10-CM

## 2021-08-09 DIAGNOSIS — I2694 Multiple subsegmental pulmonary emboli without acute cor pulmonale: Secondary | ICD-10-CM

## 2021-08-09 MED ORDER — RIVAROXABAN 20 MG PO TABS: 20.0000 mg | ORAL_TABLET | Freq: Every day | ORAL | 3 refills | Status: DC

## 2021-08-31 ENCOUNTER — Encounter: Payer: Self-pay | Admitting: Internal Medicine

## 2021-09-30 ENCOUNTER — Telehealth: Payer: Self-pay

## 2021-09-30 NOTE — Telephone Encounter (Signed)
Referral Information  Referral # Creation Date Referral Status Status Update   [id number] 06/09/2021 Authorized 06/09/2021: Status History   Status Reason Referral Type Referral Reasons Referral Class  Covered Benefit Consultation Specialty Services Required Internal   To Specialty To Provider To Location/Place of Service To Department  Gastroenterology none none [facility]   To Vendor Referred By By Location/Place of Service By Department  none Eulas Post, MD [facility]   Priority Start Date Expiration Date Referral Entered By  Routine 06/09/2021 06/09/2022 Eulas Post, MD   Visits Requested Visits Authorized Visits Completed Visits Scheduled  '1 1  1   '$ Procedure Information  Service Details Procedure Modifiers Provider Requested Approved  REF25 - AMB REFERRAL TO GASTROENTEROLOGY none  1 1   Diagnosis Information  Diagnosis  Z12.11 (ICD-10-CM) - Colon cancer screening   Referral Notes Number of Notes: 1 . Type Date User Summary Attachment  Provider Comments 06/09/2021  7:45 AM Burchette, Alinda Sierras, MD Provider Comments -  Note:   Pt needs initial screening colonoscopy.   He is on Xarelto but may come off 2 days prior to procedure.

## 2021-09-30 NOTE — Telephone Encounter (Signed)
See below for clearance for patient to stop Xarelto in referral.

## 2021-10-01 ENCOUNTER — Encounter: Payer: Self-pay | Admitting: Physician Assistant

## 2021-10-01 ENCOUNTER — Ambulatory Visit: Payer: No Typology Code available for payment source | Admitting: Physician Assistant

## 2021-10-01 VITALS — BP 110/78 | HR 80 | Ht 70.25 in | Wt 281.0 lb

## 2021-10-01 DIAGNOSIS — Z1211 Encounter for screening for malignant neoplasm of colon: Secondary | ICD-10-CM

## 2021-10-01 DIAGNOSIS — Z7901 Long term (current) use of anticoagulants: Secondary | ICD-10-CM

## 2021-10-01 MED ORDER — NA SULFATE-K SULFATE-MG SULF 17.5-3.13-1.6 GM/177ML PO SOLN: 1.0000 | Freq: Once | ORAL | 0 refills | Status: AC

## 2021-10-01 NOTE — Progress Notes (Signed)
Addendum: Reviewed and agree with assessment and management plan. Shahab Polhamus M, MD  

## 2021-10-01 NOTE — Patient Instructions (Signed)
If you are age 52 or older, your body mass index should be between 23-30. Your Body mass index is 40.03 kg/m. If this is out of the aforementioned range listed, please consider follow up with your Primary Care Provider. _______________________________________________________  The [facility] providers would like to encourage you to use [facility] to communicate with providers for non-urgent requests or questions.  Due to long hold times on the telephone, sending your provider a message by [facility] may be a faster and more efficient way to get a response.  Please allow 48 business hours for a response.  Please remember that this is for non-urgent requests.  _______________________________________________________  Dennis Bast have been scheduled for a colonoscopy. Please follow written instructions given to you at your visit today.  Please pick up your prep supplies at the pharmacy within the next 1-3 days. If you use inhalers (even only as needed), please bring them with you on the day of your procedure.  We will call you after confirmation with Dr. Elease Hashimoto about holding your Xarelto.  Follow up pending your Colonoscopy.  Thank you for entrusting me with your care and choosing [facility].  Amy Esterwood, PA-C

## 2021-10-01 NOTE — Progress Notes (Signed)
Subjective:    Patient ID: Scott Mcconnell, male    DOB: [birth year], 51 y.o.   MRN: [id number]  HPI Scott Mcconnell is a pleasant 51 year old white male, new to GI today referred by Dr. Elease Hashimoto for screening colonoscopy.  Patient relates that he did have a very remote GI work-up in 1998 and believes he had a colonoscopy at that time but had diagnosis of a "stomach ulcer". He has no current GI symptoms, specifically no complaints of abdominal pain or discomfort, no changes in bowel habits, no melena or hematochezia. He is on chronic anticoagulation with Xarelto after having what was felt to be an unprovoked DVT/PE in August 2021.  He did have hematology evaluation, with no specific hypercoagulable disorder found though patient relates that his mother and father have both had issues. He has history of obesity, BMI of 40.3, hyperlipidemia, sleep apnea for which he uses CPAP/no oxygen.  Echo 2021 with EF 55 to 60%.  Family history pertinent for paternal grandmother with history of colon cancer diagnosed in her 83s.  Review of Systems. Pertinent positive and negative review of systems were noted in the above HPI section.  All other review of systems was otherwise negative.   Outpatient Encounter Medications as of 10/01/2021  Medication Sig   acetaminophen (TYLENOL) 500 MG tablet Take 500 mg by mouth as needed.   atorvastatin (LIPITOR) 40 MG tablet Take 1 tablet (40 mg total) by mouth daily.   Na Sulfate-K Sulfate-Mg Sulf 17.5-3.13-1.6 GM/177ML SOLN Take 1 kit by mouth once for 1 dose.   rivaroxaban (XARELTO) 20 MG TABS tablet Take 1 tablet (20 mg total) by mouth daily with supper.   sildenafil (VIAGRA) 100 MG tablet TAKE 0.5-1 TABLETS (50-100 MG TOTAL) BY MOUTH DAILY AS NEEDED FOR ERECTILE DYSFUNCTION.   No facility-administered encounter medications on file as of 10/01/2021.   Allergies  Allergen Reactions   Ibuprofen     Due to Blood thinners   Penicillins     As a child   Patient Active Problem  List   Diagnosis Date Noted   Acute pulmonary embolism ([facility]) 11/05/2019   Pulmonary embolus ([facility]) 11/05/2019   Multiple subsegmental pulmonary emboli without acute cor pulmonale (HCC) 11/04/2019   Obesity (BMI 30-39.9) 11/26/2012   OSA (obstructive sleep apnea) 02/08/2012   HYPERLIPIDEMIA 04/01/2009   Social History   Socioeconomic History   Marital status: Married    Spouse name: Not on file   Number of children: 0   Years of education: Not on file   Highest education level: Not on file  Occupational History   Occupation: Government social research officer  Tobacco Use   Smoking status: Never   Smokeless tobacco: Never  Vaping Use   Vaping Use: Never used  Substance and Sexual Activity   Alcohol use: No   Drug use: No   Sexual activity: Not on file  Other Topics Concern   Not on file  Social History Narrative   2 adopted children   Social Determinants of Health   Financial Resource Strain: Not on file  Food Insecurity: Not on file  Transportation Needs: Not on file  Physical Activity: Not on file  Stress: Not on file  Social Connections: Not on file  Intimate Partner Violence: Not on file    Scott Mcconnell's family history includes Alcohol abuse in his father, paternal grandfather, and paternal uncle; Brain cancer in his maternal aunt; Breast cancer in his sister; Breast cancer (age of onset: 1) in his mother; Clotting  disorder in his father and mother; Colon cancer in his paternal grandmother; Dementia in his maternal grandmother; Diabetes in his father; Heart attack in his maternal grandfather; Heart disease in his maternal grandmother; Heart disease (age of onset: 39) in his father; Hyperlipidemia in his father and mother; Hypertension in his father; Hypothyroidism in his sister; Melanoma in his maternal aunt; Prostate cancer in his paternal grandfather; Skin cancer in his father and mother.      Objective:    Vitals:   10/01/21 1353  BP: 110/78  Pulse: 80    Physical Exam  Well-developed well-nourished white male in no acute distress.  Accompanied by his wife height, Weight, 281 BMI 40.0  HEENT; nontraumatic normocephalic, EOMI, PE R LA, sclera anicteric. Oropharynx; not examined today Neck; supple, no JVD Cardiovascular; regular rate and rhythm with S1-S2, no murmur rub or gallop Pulmonary; Clear bilaterally Abdomen; soft, obese, nontender, nondistended, no palpable mass or hepatosplenomegaly, bowel sounds are active Rectal; not done today Skin; benign exam, no jaundice rash or appreciable lesions Extremities; no clubbing cyanosis or edema skin warm and dry Neuro/Psych; alert and oriented x4, grossly nonfocal mood and affect appropriate        Assessment & Plan:   #59 51 year old white male referred for colon cancer screening, asymptomatic #2 chronic anticoagulation-on Xarelto #3 history of unprovoked DVT/PE August 2021-being maintained on anticoagulation #4 sleep apnea/CPAP use no oxygen #5.  Obesity BMI 40 # 6 hyperlipidemia  Plan; Patient is already scheduled for colonoscopy with Dr. Hilarie Fredrickson on 10/15/2021.  Procedure was reviewed, indications risks and benefits, and patient is agreeable to proceed. He will need to hold Xarelto for at least 24 hours prior to procedure.  We will communicate with his PCP Dr. Elease Hashimoto who is prescribing to assure this is reasonable for this patient. Further recommendations pending findings at colonoscopy.   Vashawn Ekstein S Taia Bramlett PA-C 10/01/2021   Cc: Eulas Post, MD

## 2021-10-01 NOTE — Telephone Encounter (Signed)
Hi Dr. Elease Hashimoto, Amy Esterwood, PA-C, wanted me to confirm that the patient is still good to hold his Xarelto starting 24 hours prior to his Colonoscopy.  Thanks,  Estill Bamberg

## 2021-10-04 NOTE — Telephone Encounter (Signed)
Patient has been advised to continue with plan to stop Xarelto 24 hours prior to procedure.

## 2021-10-09 ENCOUNTER — Other Ambulatory Visit: Payer: Self-pay | Admitting: Hematology & Oncology

## 2021-10-09 DIAGNOSIS — I824Y9 Acute embolism and thrombosis of unspecified deep veins of unspecified proximal lower extremity: Secondary | ICD-10-CM

## 2021-10-09 DIAGNOSIS — I2694 Multiple subsegmental pulmonary emboli without acute cor pulmonale: Secondary | ICD-10-CM

## 2021-10-15 ENCOUNTER — Encounter: Payer: Self-pay | Admitting: Internal Medicine

## 2021-10-15 ENCOUNTER — Ambulatory Visit (AMBULATORY_SURGERY_CENTER): Payer: No Typology Code available for payment source | Admitting: Internal Medicine

## 2021-10-15 VITALS — BP 125/83 | HR 73 | Temp 98.6°F | Resp 11 | Ht 70.0 in | Wt 281.0 lb

## 2021-10-15 DIAGNOSIS — D122 Benign neoplasm of ascending colon: Secondary | ICD-10-CM | POA: Diagnosis not present

## 2021-10-15 DIAGNOSIS — D127 Benign neoplasm of rectosigmoid junction: Secondary | ICD-10-CM | POA: Diagnosis not present

## 2021-10-15 DIAGNOSIS — D125 Benign neoplasm of sigmoid colon: Secondary | ICD-10-CM

## 2021-10-15 DIAGNOSIS — K635 Polyp of colon: Secondary | ICD-10-CM | POA: Diagnosis not present

## 2021-10-15 DIAGNOSIS — Z1211 Encounter for screening for malignant neoplasm of colon: Secondary | ICD-10-CM | POA: Diagnosis present

## 2021-10-15 HISTORY — PX: COLONOSCOPY: SHX174

## 2021-10-15 MED ORDER — SODIUM CHLORIDE 0.9 % IV SOLN: 500.0000 mL | Freq: Once | INTRAVENOUS | Status: DC

## 2021-10-15 NOTE — Progress Notes (Signed)
Vital signs checked by:AS  The medical and surgical history was reviewed and verified with the patient.  

## 2021-10-15 NOTE — Progress Notes (Signed)
Report to PACU, RN, vss, BBS= Clear.  

## 2021-10-15 NOTE — Patient Instructions (Signed)
Information on polyps, hemorrhoids, and diverticulosis given to you today.  Await pathology results.  Resume Xarelto at prior dose today.  Continue present medications and diet.   YOU HAD AN ENDOSCOPIC PROCEDURE TODAY AT [facility]:   Refer to the procedure report that was given to you for any specific questions about what was found during the examination.  If the procedure report does not answer your questions, please call your gastroenterologist to clarify.  If you requested that your care partner not be given the details of your procedure findings, then the procedure report has been included in a sealed envelope for you to review at your convenience later.  YOU SHOULD EXPECT: Some feelings of bloating in the abdomen. Passage of more gas than usual.  Walking can help get rid of the air that was put into your GI tract during the procedure and reduce the bloating. If you had a lower endoscopy (such as a colonoscopy or flexible sigmoidoscopy) you may notice spotting of blood in your stool or on the toilet paper. If you underwent a bowel prep for your procedure, you may not have a normal bowel movement for a few days.  Please Note:  You might notice some irritation and congestion in your nose or some drainage.  This is from the oxygen used during your procedure.  There is no need for concern and it should clear up in a day or so.  SYMPTOMS TO REPORT IMMEDIATELY:  Following lower endoscopy (colonoscopy or flexible sigmoidoscopy):  Excessive amounts of blood in the stool  Significant tenderness or worsening of abdominal pains  Swelling of the abdomen that is new, acute  Fever of 100F or higher   For urgent or emergent issues, a gastroenterologist can be reached at any hour by calling [phone]. Do not use MyChart messaging for urgent concerns.    DIET:  We do recommend a small meal at first, but then you may proceed to your regular diet.  Drink plenty of fluids but you  should avoid alcoholic beverages for 24 hours.  ACTIVITY:  You should plan to take it easy for the rest of today and you should NOT DRIVE or use heavy machinery until tomorrow (because of the sedation medicines used during the test).    FOLLOW UP: Our staff will call the number listed on your records the next business day following your procedure.  We will call around 7:15- 8:00 am to check on you and address any questions or concerns that you may have regarding the information given to you following your procedure. If we do not reach you, we will leave a message.  If you develop any symptoms (ie: fever, flu-like symptoms, shortness of breath, cough etc.) before then, please call [phone].  If you test positive for Covid 19 in the 2 weeks post procedure, please call and report this information to [place].    If any biopsies were taken you will be contacted by phone or by letter within the next 1-3 weeks.  Please call us at [phone] if you have not heard about the biopsies in 3 weeks.    SIGNATURES/CONFIDENTIALITY: You and/or your care partner have signed paperwork which will be entered into your electronic medical record.  These signatures attest to the fact that that the information above on your After Visit Summary has been reviewed and is understood.  Full responsibility of the confidentiality of this discharge information lies with you and/or your care-partner.

## 2021-10-15 NOTE — Progress Notes (Signed)
Called to room to assist during endoscopic procedure.  Patient ID and intended procedure confirmed with present staff. Received instructions for my participation in the procedure from the performing physician.  

## 2021-10-15 NOTE — Op Note (Signed)
[facility] Patient Name: Scott Mcconnell Procedure Date: 10/15/2021 8:36 AM MRN: [id number] Endoscopist: Jerene Bears , MD Age: 51 Referring MD:  Date of Birth: 02-27-1971 Gender: Male Account #: [id number] Procedure:                Colonoscopy Indications:              Screening for colorectal malignant neoplasm Medicines:                Monitored Anesthesia Care Procedure:                Pre-Anesthesia Assessment:                           - Prior to the procedure, a History and Physical                            was performed, and patient medications and                            allergies were reviewed. The patient's tolerance of                            previous anesthesia was also reviewed. The risks                            and benefits of the procedure and the sedation                            options and risks were discussed with the patient.                            All questions were answered, and informed consent                            was obtained. Prior Anticoagulants: The patient has                            taken Xarelto (rivaroxaban), last dose was 1 day                            prior to procedure. ASA Grade Assessment: III - A                            patient with severe systemic disease. After                            reviewing the risks and benefits, the patient was                            deemed in satisfactory condition to undergo the                            procedure.  After obtaining informed consent, the colonoscope                            was passed under direct vision. Throughout the                            procedure, the patient's blood pressure, pulse, and                            oxygen saturations were monitored continuously. The                            Olympus CF-HQ190L [id number]) Colonoscope was                            introduced through the anus and advanced to the                             cecum, identified by appendiceal orifice and                            ileocecal valve. The colonoscopy was performed                            without difficulty. The patient tolerated the                            procedure well. The quality of the bowel                            preparation was good. The ileocecal valve,                            appendiceal orifice, and rectum were photographed. Scope In: 8:38:33 AM Scope Out: 8:54:36 AM Scope Withdrawal Time: 0 hours 14 minutes 21 seconds  Total Procedure Duration: 0 hours 16 minutes 3 seconds  Findings:                 The digital rectal exam was normal.                           A 7 mm polyp was found in the ascending colon. The                            polyp was semi-pedunculated. The polyp was removed                            with a cold snare. Resection and retrieval were                            complete.                           A 4 mm polyp was found in the recto-sigmoid colon.  The polyp was sessile. The polyp was removed with a                            cold snare. Resection and retrieval were complete.                           A few small-mouthed diverticula were found in the                            sigmoid colon.                           Internal hemorrhoids were found during                            retroflexion. The hemorrhoids were medium-sized. Complications:            No immediate complications. Estimated Blood Loss:     Estimated blood loss was minimal. Impression:               - One 7 mm polyp in the ascending colon, removed                            with a cold snare. Resected and retrieved.                           - One 4 mm polyp at the recto-sigmoid colon,                            removed with a cold snare. Resected and retrieved.                           - Diverticulosis in the sigmoid colon.                           - Internal  hemorrhoids. Recommendation:           - Patient has a contact number available for                            emergencies. The signs and symptoms of potential                            delayed complications were discussed with the                            patient. Return to normal activities tomorrow.                            Written discharge instructions were provided to the                            patient.                           - Resume previous diet.                           -  Continue present medications.                           - Resume Xarelto (rivaroxaban) at prior dose today.                            Refer to managing physician for further adjustment                            of therapy.                           - Await pathology results.                           - Repeat colonoscopy is recommended. The                            colonoscopy date will be determined after pathology                            results from today's exam become available for                            review. Jerene Bears, MD 10/15/2021 8:57:43 AM This report has been signed electronically.

## 2021-10-15 NOTE — Progress Notes (Signed)
See office note dated 10/01/2021 for details and current H&P. Screening colonoscopy and patient on Xarelto, Xarelto has been held 2 days  Patient is appropriate for colonoscopy in the [facility] today

## 2021-10-18 ENCOUNTER — Telehealth: Payer: Self-pay

## 2021-10-18 NOTE — Telephone Encounter (Signed)
  Follow up Call-     10/15/2021    7:45 AM  Call back number  Post procedure Call Back phone  # [phone]  Permission to leave phone message Yes     Patient questions:  Do you have a fever, pain , or abdominal swelling? No. Pain Score  0 *  Have you tolerated food without any problems? Yes.    Have you been able to return to your normal activities? Yes.    Do you have any questions about your discharge instructions: Diet   No. Medications  No. Follow up visit  No.  Do you have questions or concerns about your Care? No.  Actions: * If pain score is 4 or above: No action needed, pain <4.

## 2021-10-19 ENCOUNTER — Encounter: Payer: Self-pay | Admitting: Internal Medicine

## 2021-12-10 ENCOUNTER — Ambulatory Visit (INDEPENDENT_AMBULATORY_CARE_PROVIDER_SITE_OTHER): Payer: No Typology Code available for payment source

## 2021-12-10 DIAGNOSIS — Z23 Encounter for immunization: Secondary | ICD-10-CM | POA: Diagnosis not present

## 2021-12-29 ENCOUNTER — Telehealth: Payer: Self-pay | Admitting: Family Medicine

## 2021-12-29 NOTE — Telephone Encounter (Signed)
Pt is calling and would like a refill on sildenafil (VIAGRA) 100 MG tablet  [id number] [address], [place] - [place] Phone:  [phone]  Fax:  [phone]

## 2021-12-30 MED ORDER — SILDENAFIL CITRATE 100 MG PO TABS: 50.0000 mg | ORAL_TABLET | Freq: Every day | ORAL | 11 refills | Status: DC | PRN

## 2021-12-30 NOTE — Telephone Encounter (Signed)
Rx sent 

## 2022-07-10 ENCOUNTER — Other Ambulatory Visit: Payer: Self-pay | Admitting: Family Medicine

## 2022-07-11 ENCOUNTER — Other Ambulatory Visit: Payer: Self-pay | Admitting: Family Medicine

## 2022-08-22 ENCOUNTER — Encounter: Payer: No Typology Code available for payment source | Admitting: Family Medicine

## 2022-08-24 ENCOUNTER — Ambulatory Visit (INDEPENDENT_AMBULATORY_CARE_PROVIDER_SITE_OTHER): Payer: No Typology Code available for payment source | Admitting: Family Medicine

## 2022-08-24 ENCOUNTER — Encounter: Payer: Self-pay | Admitting: Family Medicine

## 2022-08-24 VITALS — BP 104/60 | HR 76 | Temp 98.2°F | Ht 70.87 in | Wt 285.8 lb

## 2022-08-24 DIAGNOSIS — G4733 Obstructive sleep apnea (adult) (pediatric): Secondary | ICD-10-CM

## 2022-08-24 DIAGNOSIS — Z0000 Encounter for general adult medical examination without abnormal findings: Secondary | ICD-10-CM | POA: Diagnosis not present

## 2022-08-24 DIAGNOSIS — D229 Melanocytic nevi, unspecified: Secondary | ICD-10-CM

## 2022-08-24 LAB — CBC WITH DIFFERENTIAL/PLATELET
Basophils Absolute: 0 10*3/uL (ref 0.0–0.1)
Basophils Relative: 0.6 % (ref 0.0–3.0)
Eosinophils Absolute: 0.2 10*3/uL (ref 0.0–0.7)
Eosinophils Relative: 2.4 % (ref 0.0–5.0)
HCT: 50.1 % (ref 39.0–52.0)
Hemoglobin: 16.7 g/dL (ref 13.0–17.0)
Lymphocytes Relative: 41.6 % (ref 12.0–46.0)
Lymphs Abs: 2.8 10*3/uL (ref 0.7–4.0)
MCHC: 33.3 g/dL (ref 30.0–36.0)
MCV: 87.9 fl (ref 78.0–100.0)
Monocytes Absolute: 0.5 10*3/uL (ref 0.1–1.0)
Monocytes Relative: 7.3 % (ref 3.0–12.0)
Neutro Abs: 3.2 10*3/uL (ref 1.4–7.7)
Neutrophils Relative %: 48.1 % (ref 43.0–77.0)
Platelets: 242 10*3/uL (ref 150.0–400.0)
RBC: 5.7 Mil/uL (ref 4.22–5.81)
RDW: 13.2 % (ref 11.5–15.5)
WBC: 6.7 10*3/uL (ref 4.0–10.5)

## 2022-08-24 LAB — LIPID PANEL
Cholesterol: 151 mg/dL (ref 0–200)
HDL: 37.6 mg/dL — ABNORMAL LOW (ref >39.00)
LDL Cholesterol: 84 mg/dL (ref 0–99)
NonHDL: 113.17
Total CHOL/HDL Ratio: 4
Triglycerides: 148 mg/dL (ref 0.0–149.0)
VLDL: 29.6 mg/dL (ref 0.0–40.0)

## 2022-08-24 LAB — PSA: PSA: 1.28 ng/mL (ref 0.10–4.00)

## 2022-08-24 LAB — BASIC METABOLIC PANEL
BUN: 14 mg/dL (ref 6–23)
CO2: 31 mEq/L (ref 19–32)
Calcium: 9.5 mg/dL (ref 8.4–10.5)
Chloride: 101 mEq/L (ref 96–112)
Creatinine, Ser: 0.95 mg/dL (ref 0.40–1.50)
GFR: 91.97 mL/min (ref >60.00)
Glucose, Bld: 95 mg/dL (ref 70–99)
Potassium: 4.3 mEq/L (ref 3.5–5.1)
Sodium: 141 mEq/L (ref 135–145)

## 2022-08-24 LAB — HEPATIC FUNCTION PANEL
ALT: 34 U/L (ref 0–53)
AST: 24 U/L (ref 0–37)
Albumin: 4.3 g/dL (ref 3.5–5.2)
Alkaline Phosphatase: 45 U/L (ref 39–117)
Bilirubin, Direct: 0.2 mg/dL (ref 0.0–0.3)
Total Bilirubin: 1 mg/dL (ref 0.2–1.2)
Total Protein: 7.7 g/dL (ref 6.0–8.3)

## 2022-08-24 NOTE — Progress Notes (Unsigned)
Established Patient Office Visit  Subjective   Patient ID: Scott Mcconnell, male    DOB: 02/06/1971  Age: 52 y.o. MRN: [id number]  Chief Complaint  Patient presents with   Annual Exam    HPI  {History (Optional):23778} Scott Mcconnell is here for physical exam.  He has history of obesity, obstructive sleep apnea, history of pulmonary emboli.  Remains on Xarelto.  History of unprovoked pulmonary emboli and very strong family history of DVT/PEs in mother and father.  Sleep apnea history.  Uses CPAP nightly.  Has not had checkup in over 10 years and states his equipment is getting fairly old.  He feels restless frequently at night and would like to get repeat sleep evaluation.  Generally doing well.  He has been walking over 10,000 steps per day.  No chest pain or dyspnea with exercise.  He has hyperlipidemia treated with Lipitor 40 mg daily.  Previous coronary calcium score of 80.  Positive family history of premature CAD in his father in his 2s.  Health maintenance reviewed  -Tetanus due 2032 -Colonoscopy due 2030 -Has had Shingrix vaccine already  Family history-father had type 2 diabetes and coronary disease in his 85s and history of PE.  Mother with history of DVT/PE and history of breast cancer.  Social history-he is married.  2 adopted children.  Son graduated from [place] [employer].  He has a daughter who is age 64 who still lives at home.  Patient is a non-smoker.  Past Medical History:  Diagnosis Date   DVT (deep venous thrombosis) (HCC)    HYPERLIPIDEMIA 04/01/2009   Kidney stones    Peptic ulcer    Pulmonary embolism (HCC)    Sleep apnea    CPAP   Past Surgical History:  Procedure Laterality Date   COLONOSCOPY  10/15/2021   Leone Payor   TONSILLECTOMY      reports that he has never smoked. He has never used smokeless tobacco. He reports that he does not drink alcohol and does not use drugs. family history includes Alcohol abuse in his father, paternal grandfather, and paternal  uncle; Brain cancer in his maternal aunt; Breast cancer in his sister; Breast cancer (age of onset: 34) in his mother; Clotting disorder in his father and mother; Colon cancer in his paternal grandmother; Dementia in his maternal grandmother; Diabetes in his father; Heart attack in his maternal grandfather; Heart disease in his maternal grandmother; Heart disease (age of onset: 36) in his father; Hyperlipidemia in his father and mother; Hypertension in his father; Hypothyroidism in his sister; Melanoma in his maternal aunt; Prostate cancer in his paternal grandfather; Skin cancer in his father and mother. Allergies  Allergen Reactions   Ibuprofen     Due to Blood thinners   Penicillins     As a child    Review of Systems  Constitutional:  Negative for chills, fever, malaise/fatigue and weight loss.  HENT:  Negative for hearing loss.   Eyes:  Negative for blurred vision and double vision.  Respiratory:  Negative for cough and shortness of breath.   Cardiovascular:  Negative for chest pain, palpitations and leg swelling.  Gastrointestinal:  Negative for abdominal pain, blood in stool, constipation and diarrhea.  Genitourinary:  Negative for dysuria.  Skin:  Negative for rash.  Neurological:  Negative for dizziness, speech change, seizures, loss of consciousness and headaches.  Psychiatric/Behavioral:  Negative for depression.       Objective:     BP 104/60 (BP Location: Left Arm,  Patient Position: Sitting, Cuff Size: Large)   Pulse 76   Temp 98.2 F (36.8 C) (Oral)   Ht 5' 10.87" (1.8 m)   Wt 285 lb 12.8 oz (129.6 kg)   SpO2 96%   BMI 40.01 kg/m  BP Readings from Last 3 Encounters:  08/24/22 104/60  10/15/21 125/83  10/01/21 110/78   Wt Readings from Last 3 Encounters:  08/24/22 285 lb 12.8 oz (129.6 kg)  10/15/21 281 lb (127.5 kg)  10/01/21 281 lb (127.5 kg)      Physical Exam Vitals reviewed.  Constitutional:      General: He is not in acute distress.    Appearance:  He is well-developed.  HENT:     Head: Normocephalic and atraumatic.     Right Ear: External ear normal.     Left Ear: External ear normal.  Eyes:     Conjunctiva/sclera: Conjunctivae normal.     Pupils: Pupils are equal, round, and reactive to light.  Neck:     Thyroid: No thyromegaly.  Cardiovascular:     Rate and Rhythm: Normal rate and regular rhythm.     Heart sounds: Normal heart sounds. No murmur heard. Pulmonary:     Effort: No respiratory distress.     Breath sounds: No wheezing or rales.  Abdominal:     General: Bowel sounds are normal. There is no distension.     Palpations: Abdomen is soft.     Tenderness: There is no abdominal tenderness. There is no guarding or rebound.     Hernia: A hernia is present.     Comments: He has fairly large umbilical hernia which is soft and nontender.  Diastases recti versus possible ventral hernia  Musculoskeletal:     Cervical back: Normal range of motion and neck supple.     Right lower leg: No edema.     Left lower leg: No edema.  Lymphadenopathy:     Cervical: No cervical adenopathy.  Skin:    Findings: No rash.     Comments: Has a couple scattered seborrheic keratosis.  Left mid thoracic back approximately 6 mm nevus with some color variegation and mild asymmetry.  Neurological:     Mental Status: He is alert and oriented to person, place, and time.     Cranial Nerves: No cranial nerve deficit.     Deep Tendon Reflexes: Reflexes normal.      No results found for any visits on 08/24/22.  {Labs (Optional):23779}  The 10-year ASCVD risk score (Arnett DK, et al., 2019) is: 2.9%    Assessment & Plan:   Problem List Items Addressed This Visit   None Visit Diagnoses     Physical exam    -  Primary   Relevant Orders   Basic metabolic panel   CBC with Differential/Platelet   Lipid panel   Hepatic function panel   PSA     Patient has history of hyperlipidemia and history of pulmonary emboli on chronic Xarelto and  atorvastatin.  Positive family history of premature CAD.  Prior coronary calcium score of 80.  Obstructive sleep apnea on CPAP  -Continue regular exercise habits -Strongly encouraged to lose some weight -Continue annual flu vaccine -Set up pulmonary referral to reassess sleep apnea and reassess his current equipment -Patient would like to consider referral to general surgeon at some point regarding his umbilical hernia.  He will check on insurance coverage first -Patient requesting Derm referral.  We will set up.  We have recommended that  he get consideration for biopsy of left thoracic back nevus if he cannot get into see dermatology soon within the next couple months  No follow-ups on file.    Evelena Peat, MD

## 2022-09-26 ENCOUNTER — Other Ambulatory Visit: Payer: Self-pay | Admitting: Hematology & Oncology

## 2022-09-26 ENCOUNTER — Encounter: Payer: Self-pay | Admitting: Adult Health

## 2022-09-26 ENCOUNTER — Ambulatory Visit: Payer: No Typology Code available for payment source | Admitting: Adult Health

## 2022-09-26 DIAGNOSIS — G4733 Obstructive sleep apnea (adult) (pediatric): Secondary | ICD-10-CM | POA: Diagnosis not present

## 2022-09-26 DIAGNOSIS — I2694 Multiple subsegmental pulmonary emboli without acute cor pulmonale: Secondary | ICD-10-CM

## 2022-09-26 DIAGNOSIS — I824Y9 Acute embolism and thrombosis of unspecified deep veins of unspecified proximal lower extremity: Secondary | ICD-10-CM

## 2022-09-26 NOTE — Progress Notes (Unsigned)
@Patient  ID: Scott Mcconnell, male    DOB: 05-04-1970, 52 y.o.   MRN: [id number]  Chief Complaint  Patient presents with   Consult    Referring provider: Kristian Covey, MD  HPI: 52 year old male seen for sleep consult September 26, 2022 to establish for sleep apnea Medical history significant for unprovoked PE with a strong family history of VTE on lifelong anticoagulation therapy  TEST/EVENTS :   09/26/2022 Sleep consult  Patient presents for a sleep consult today to establish for sleep apnea.  Kindly referred by Dr. Caryl Never.  Patient has had sleep apnea for greater than 20 years.  Has been on CPAP since then.  Says that he wears a CPAP every night cannot sleep without it.  Feels that he is rested with decreased daytime sleepiness.  He says he has not had anyone managed his CPAP in a while.  Wanted to make sure his CPAP machine is working well.  1 make sure he did not need a new CPAP machine.  Typically goes to bed about 10 PM.  Goes to sleep very quickly.  Only about once each night.  Gets up at 6 AM.  Weight has been steady over the last 2 years.  Current weight is at 266 pounds with a BMI of 37.  Does not take any sleep aids.  No removable dental work.  Rarely takes a nap.  No symptoms suspicious for cataplexy or sleep paralysis.  CPAP download shows excellent compliance with 100% usage.  Daily average usage at 8 hours.  Patient is on CPAP 9 pharmacies.  AHI 0.9.  Minimal leaks. Uses [facility].  Currently using a nasal mask.  Has a sleep number bed and sleeps with head of bed at an incline.  Social history patient is married.  Lives at home with his wife and daughter.  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  Exercises on a regular basis.  Typically walks at least 3 times a day.  Tries to get in 10,000 steps daily.  He is a never smoker.  No alcohol or drug use.  Family history positive for blood clots and sleep apnea.  Sister had breast cancer.  Mom had  breast cancer.  History of coronary artery disease and diabetes  Past Surgical History:  Procedure Laterality Date   COLONOSCOPY  10/15/2021   Leone Payor   TONSILLECTOMY       Allergies  Allergen Reactions   Ibuprofen     Due to Blood thinners   Penicillins     As a child    Immunization History  Administered Date(s) Administered   Hepatitis A 03/08/1999, 03/07/2002   Influenza Split 12/12/2011   Influenza,inj,Quad PF,6+ Mos 11/26/2012, 11/27/2013, 12/23/2014, 11/25/2015, 12/07/2016, 12/12/2017, 12/28/2018, 11/13/2019, 01/15/2021, 12/10/2021   PFIZER(Purple Top)SARS-COV-2 Vaccination 06/07/2019, 07/01/2019   Td 03/07/2002, 06/08/2020   Tdap 09/13/2010   Zoster Recombinant(Shingrix) 06/12/2020, 09/04/2020    Past Medical History:  Diagnosis Date   DVT (deep venous thrombosis) (HCC)    HYPERLIPIDEMIA 04/01/2009   Kidney stones    Peptic ulcer    Pulmonary embolism (HCC)    Sleep apnea    CPAP    Tobacco History: Social History   Tobacco Use  Smoking Status Never  Smokeless Tobacco Never   Counseling given: Not Answered   Outpatient Medications Prior to Visit  Medication Sig Dispense Refill   acetaminophen (TYLENOL) 500 MG tablet Take 500 mg by mouth as needed.  atorvastatin (LIPITOR) 40 MG tablet TAKE 1 TABLET BY MOUTH EVERY DAY 90 tablet 0   sildenafil (VIAGRA) 100 MG tablet Take 0.5-1 tablets (50-100 mg total) by mouth daily as needed for erectile dysfunction. 6 tablet 11   XARELTO 20 MG TABS tablet TAKE 1 TABLET BY MOUTH DAILY WITH SUPPER 90 tablet 1   No facility-administered medications prior to visit.     Review of Systems:   Constitutional:   No  weight loss, night sweats,  Fevers, chills, fatigue, or  lassitude.  HEENT:   No headaches,  Difficulty swallowing,  Tooth/dental problems, or  Sore throat,                No sneezing, itching, ear ache, nasal congestion, post nasal drip,   CV:  No chest pain,  Orthopnea, PND, swelling in lower  extremities, anasarca, dizziness, palpitations, syncope.   GI  No heartburn, indigestion, abdominal pain, nausea, vomiting, diarrhea, change in bowel habits, loss of appetite, bloody stools.   Resp: No shortness of breath with exertion or at rest.  No excess mucus, no productive cough,  No non-productive cough,  No coughing up of blood.  No change in color of mucus.  No wheezing.  No chest wall deformity  Skin: no rash or lesions.  GU: no dysuria, change in color of urine, no urgency or frequency.  No flank pain, no hematuria   MS:  No joint pain or swelling.  No decreased range of motion.  No back pain.    Physical Exam  BP 112/80 (BP Location: Left Arm, Patient Position: Sitting, Cuff Size: Large)   Pulse 82   Ht 5\' 11"  (1.803 m)   Wt 266 lb 3.2 oz (120.7 kg)   SpO2 97%   BMI 37.13 kg/m   GEN: A/Ox3; pleasant , NAD, well nourished    HEENT:  [place]/AT,  NOSE-clear, THROAT-clear, no lesions, no postnasal drip or exudate noted.  Class III MP airway  NECK:  Supple w/ fair ROM; no JVD; normal carotid impulses w/o bruits; no thyromegaly or nodules palpated; no lymphadenopathy.    RESP  Clear  P & A; w/o, wheezes/ rales/ or rhonchi. no accessory muscle use, no dullness to percussion  CARD:  RRR, no m/r/g, no peripheral edema, pulses intact, no cyanosis or clubbing.  GI:   Soft & nt; nml bowel sounds; no organomegaly or masses detected.   Musco: Warm bil, no deformities or joint swelling noted.   Neuro: alert, no focal deficits noted.    Skin: Warm, no lesions or rashes    Lab Results:  CBC    ProBNP No results found for: "PROBNP"  Imaging: No results found.  Administration History     None           No data to display          No results found for: "NITRICOXIDE"      Assessment & Plan:   No problem-specific Assessment & Plan notes found for this encounter.     Rubye Oaks, NP 09/26/2022

## 2022-09-26 NOTE — Patient Instructions (Signed)
Continue on CPAP At bedtime   Keep up good work.  Do not drive if sleepy  Work on healthy weight loss Follow up in 1 year and As needed

## 2022-09-29 ENCOUNTER — Telehealth: Payer: Self-pay | Admitting: *Deleted

## 2022-09-29 NOTE — Assessment & Plan Note (Signed)
Excellent control compliance on nocturnal CPAP.  Longstanding history of sleep apnea.  Appears to be very well-controlled.  Will obtain sleep study copies from home care D[facility].  Continue on current settings.  Patient education on sleep apnea  Plan  Patient Instructions  Continue on CPAP At bedtime   Keep up good work.  Do not drive if sleepy  Work on healthy weight loss Follow up in 1 year and As needed

## 2022-09-29 NOTE — Telephone Encounter (Signed)
Called and spoke with patient regarding his CPAP machine and supplies.  He states that he does not need a new CPAP machine and he has plenty of supplies for now.  He said he will let us know when his supplies get low so we can put in an order for supplies.  Nothing further needed.

## 2022-10-07 ENCOUNTER — Other Ambulatory Visit: Payer: Self-pay | Admitting: Family Medicine

## 2022-12-09 ENCOUNTER — Ambulatory Visit (INDEPENDENT_AMBULATORY_CARE_PROVIDER_SITE_OTHER): Payer: No Typology Code available for payment source

## 2022-12-09 DIAGNOSIS — Z23 Encounter for immunization: Secondary | ICD-10-CM

## 2023-03-28 ENCOUNTER — Other Ambulatory Visit: Payer: Self-pay | Admitting: Hematology & Oncology

## 2023-03-28 DIAGNOSIS — I824Y9 Acute embolism and thrombosis of unspecified deep veins of unspecified proximal lower extremity: Secondary | ICD-10-CM

## 2023-03-28 DIAGNOSIS — I2694 Multiple subsegmental pulmonary emboli without acute cor pulmonale: Secondary | ICD-10-CM

## 2023-03-30 ENCOUNTER — Other Ambulatory Visit: Payer: Self-pay | Admitting: Family Medicine

## 2023-03-31 ENCOUNTER — Other Ambulatory Visit: Payer: Self-pay | Admitting: Family Medicine

## 2023-08-11 ENCOUNTER — Other Ambulatory Visit: Payer: Self-pay | Admitting: Family Medicine

## 2023-08-14 ENCOUNTER — Telehealth: Payer: Self-pay | Admitting: Family Medicine

## 2023-08-14 DIAGNOSIS — K429 Umbilical hernia without obstruction or gangrene: Secondary | ICD-10-CM

## 2023-08-14 NOTE — Telephone Encounter (Signed)
 Copied from CRM [id number]. Topic: Referral - Request for Referral >> Aug 14, 2023  3:01 PM Juleen Oakland F wrote: Did the patient discuss referral with their provider in the last year? Yes (If No - schedule appointment) (If Yes - send message)  Appointment offered? No  Type of order/referral and detailed reason for visit: Hernia Surgery   Preference of office, provider, location: N/A  If referral order, have you been seen by this specialty before? No (If Yes, this issue or another issue? When? Where?  Can we respond through MyChart? Yes, please let patient know when referral is in

## 2023-08-15 NOTE — Telephone Encounter (Signed)
 Referral placed and patient aware

## 2023-08-21 ENCOUNTER — Telehealth: Payer: Self-pay

## 2023-08-21 NOTE — Telephone Encounter (Signed)
 Fax received from [facility] for surgical clearance for hernia surgery as patient is on Xarelto .  Reviewed fax with Dr. Maria Shiner who stated he needs to see the patient prior to giving clearance as the patient hasn't been seen by any providers in this office since 2022.  This RN called patient to let patient know that he will need to be scheduled to be seen for clearance. Pt very  firmly stated that he refuses to be seen in this office  nothing against the doctor but I get a facility charge to be seen there and I refuse to have to pay two bills to be seen  Pt requested that the clearance for his surgery be done by his PCP.  This RN sent a request to his PCP to see if he will write the clearance. This RN also spoke to [facility] CMA at [facility] and updated on her the plan.

## 2023-08-25 ENCOUNTER — Encounter: Payer: Self-pay | Admitting: Family Medicine

## 2023-08-25 ENCOUNTER — Ambulatory Visit: Admitting: Family Medicine

## 2023-08-25 VITALS — BP 126/68 | HR 79 | Temp 97.8°F | Ht 70.87 in | Wt 287.5 lb

## 2023-08-25 DIAGNOSIS — Z0000 Encounter for general adult medical examination without abnormal findings: Secondary | ICD-10-CM | POA: Diagnosis not present

## 2023-08-25 DIAGNOSIS — Z23 Encounter for immunization: Secondary | ICD-10-CM | POA: Diagnosis not present

## 2023-08-25 LAB — BASIC METABOLIC PANEL WITH GFR
BUN: 15 mg/dL (ref 6–23)
CO2: 30 meq/L (ref 19–32)
Calcium: 9.1 mg/dL (ref 8.4–10.5)
Chloride: 102 meq/L (ref 96–112)
Creatinine, Ser: 0.96 mg/dL (ref 0.40–1.50)
GFR: 90.18 mL/min (ref >60.00)
Glucose, Bld: 106 mg/dL — ABNORMAL HIGH (ref 70–99)
Potassium: 4.1 meq/L (ref 3.5–5.1)
Sodium: 140 meq/L (ref 135–145)

## 2023-08-25 LAB — LIPID PANEL
Cholesterol: 140 mg/dL (ref 0–200)
HDL: 32.3 mg/dL — ABNORMAL LOW (ref >39.00)
LDL Cholesterol: 74 mg/dL (ref 0–99)
NonHDL: 107.49
Total CHOL/HDL Ratio: 4
Triglycerides: 165 mg/dL — ABNORMAL HIGH (ref 0.0–149.0)
VLDL: 33 mg/dL (ref 0.0–40.0)

## 2023-08-25 LAB — CBC WITH DIFFERENTIAL/PLATELET
Basophils Absolute: 0 10*3/uL (ref 0.0–0.1)
Basophils Relative: 0.8 % (ref 0.0–3.0)
Eosinophils Absolute: 0.1 10*3/uL (ref 0.0–0.7)
Eosinophils Relative: 2.4 % (ref 0.0–5.0)
HCT: 47.8 % (ref 39.0–52.0)
Hemoglobin: 16.3 g/dL (ref 13.0–17.0)
Lymphocytes Relative: 43.7 % (ref 12.0–46.0)
Lymphs Abs: 2.4 10*3/uL (ref 0.7–4.0)
MCHC: 34.1 g/dL (ref 30.0–36.0)
MCV: 86.6 fl (ref 78.0–100.0)
Monocytes Absolute: 0.3 10*3/uL (ref 0.1–1.0)
Monocytes Relative: 6 % (ref 3.0–12.0)
Neutro Abs: 2.6 10*3/uL (ref 1.4–7.7)
Neutrophils Relative %: 47.1 % (ref 43.0–77.0)
Platelets: 224 10*3/uL (ref 150.0–400.0)
RBC: 5.52 Mil/uL (ref 4.22–5.81)
RDW: 13 % (ref 11.5–15.5)
WBC: 5.5 10*3/uL (ref 4.0–10.5)

## 2023-08-25 LAB — HEPATIC FUNCTION PANEL
ALT: 35 U/L (ref 0–53)
AST: 22 U/L (ref 0–37)
Albumin: 4.2 g/dL (ref 3.5–5.2)
Alkaline Phosphatase: 42 U/L (ref 39–117)
Bilirubin, Direct: 0.2 mg/dL (ref 0.0–0.3)
Total Bilirubin: 1 mg/dL (ref 0.2–1.2)
Total Protein: 7.1 g/dL (ref 6.0–8.3)

## 2023-08-25 LAB — HEMOGLOBIN A1C: Hgb A1c MFr Bld: 6.2 % (ref 4.6–6.5)

## 2023-08-25 LAB — PSA: PSA: 0.75 ng/mL (ref 0.10–4.00)

## 2023-08-25 NOTE — Progress Notes (Signed)
 Established Patient Office Visit  Subjective   Patient ID: Scott Mcconnell, male    DOB: 1970/05/11  Age: 53 y.o. MRN: [id number]  Chief Complaint  Patient presents with   Annual Exam    HPI   Scott Mcconnell is seen for annual physical exam.  Generally doing well.  He has umbilical hernia and is trying to get scheduled for surgery for that.  He does have past history of pulmonary embolus strong family history in both parents.  Is on chronic Xarelto .  No recent dyspnea or chest pain.  He is very active with exercise and walks most days.  Blood pressure has been excellent.  He does have hyperlipidemia with prior coronary calcium  score of 80.  Takes Lipitor 40 mg daily.  Health maintenance reviewed:  Health Maintenance  Topic Date Due   COVID-19 Vaccine (3 - 2024-25 season) 11/06/2022   INFLUENZA VACCINE  10/06/2023   Colonoscopy  10/15/2028   DTaP/Tdap/Td (4 - Td or Tdap) 06/09/2030   Hepatitis C Screening  Completed   HIV Screening  Completed   Zoster Vaccines- Shingrix  Completed   Pneumococcal Vaccine 55-91 Years old  Aged Out   HPV VACCINES  Aged Out   Meningococcal B Vaccine  Aged Out   Family history-father had type 2 diabetes and coronary disease in his 52s and history of PE.  Mother with history of DVT/PE and history of breast cancer.   Social history-he is married.  2 adopted children.  Son graduated from [place] state.  He has a daughter who is age 99.  Patient is a non-smoker.  Review of Systems  Constitutional:  Negative for malaise/fatigue.  Eyes:  Negative for blurred vision.  Respiratory:  Negative for shortness of breath.   Cardiovascular:  Negative for chest pain.  Gastrointestinal:  Negative for blood in stool.  Neurological:  Negative for dizziness, weakness and headaches.      Objective:     BP 126/68 (BP Location: Left Arm, Patient Position: Sitting, Cuff Size: Large)   Pulse 79   Temp 97.8 F (36.6 C) (Oral)   Ht 5' 10.87 (1.8 m)   Wt 287 lb 8 oz (130.4  kg)   SpO2 95%   BMI 40.25 kg/m  BP Readings from Last 3 Encounters:  08/25/23 126/68  09/26/22 112/80  08/24/22 104/60   Wt Readings from Last 3 Encounters:  08/25/23 287 lb 8 oz (130.4 kg)  09/26/22 266 lb 3.2 oz (120.7 kg)  08/24/22 285 lb 12.8 oz (129.6 kg)      Physical Exam Vitals reviewed.  Constitutional:      General: He is not in acute distress.    Appearance: He is well-developed.  HENT:     Head: Normocephalic and atraumatic.     Right Ear: External ear normal.     Left Ear: External ear normal.   Eyes:     Conjunctiva/sclera: Conjunctivae normal.     Pupils: Pupils are equal, round, and reactive to light.   Neck:     Thyroid : No thyromegaly.   Cardiovascular:     Rate and Rhythm: Normal rate and regular rhythm.     Heart sounds: Normal heart sounds. No murmur heard. Pulmonary:     Effort: No respiratory distress.     Breath sounds: No wheezing or rales.  Abdominal:     General: Bowel sounds are normal. There is no distension.     Palpations: Abdomen is soft.     Tenderness: There is  no abdominal tenderness. There is no guarding or rebound.     Hernia: A hernia is present.     Comments: Fairly large umbilical hernia.  This is soft.   Musculoskeletal:     Cervical back: Normal range of motion and neck supple.  Lymphadenopathy:     Cervical: No cervical adenopathy.   Skin:    Findings: No rash.   Neurological:     Mental Status: He is alert and oriented to person, place, and time.     Cranial Nerves: No cranial nerve deficit.     Deep Tendon Reflexes: Reflexes normal.      No results found for any visits on 08/25/23.    The 10-year ASCVD risk score (Arnett DK, et al., 2019) is: 4.2%    Assessment & Plan:   Problem List Items Addressed This Visit   None Visit Diagnoses       Physical exam    -  Primary   Relevant Orders   Basic metabolic panel with GFR   Lipid panel   CBC with Differential/Platelet   Hepatic function panel    PSA   Hemoglobin A1c     Need for pneumococcal vaccination       Relevant Orders   Pneumococcal conjugate vaccine 20-valent (Prevnar 72)     53 year old male here for physical exam.  He has history of hyperlipidemia.  Strong family history of CAD in father as well as family history of type 2 diabetes.  We discussed the following health maintenance items  -Recommend Prevnar 20 and patient consents - Continue annual flu vaccine - Continue regular exercise habits - Obtain labs as above.  Include A1c with strong family history of type 2 diabetes - Will send in medical clearance for hernia surgery.  He knows to stop his Xarelto  2 days prior to surgery  No follow-ups on file.    Scott Lamy, MD

## 2023-08-27 ENCOUNTER — Ambulatory Visit: Payer: Self-pay | Admitting: Family Medicine

## 2023-09-01 ENCOUNTER — Ambulatory Visit: Payer: Self-pay | Admitting: Surgery

## 2023-09-01 NOTE — Progress Notes (Signed)
 Sent message, via epic in basket, requesting orders in epic from Careers adviser.

## 2023-09-05 ENCOUNTER — Encounter (HOSPITAL_COMMUNITY)
Admission: RE | Admit: 2023-09-05 | Discharge: 2023-09-05 | Disposition: A | Discharge status: Home / Self Care | Source: Ambulatory Visit | Attending: Family Medicine | Admitting: Family Medicine

## 2023-09-05 NOTE — Progress Notes (Signed)
 Anesthesia Review:  PCP: Wolm Scarlet [facility] 08/25/23  Cardiologist : Therese Madelin Stank, NP [facility] 03/28/22   PPM/ ICD: Device Orders: Rep Notified:  Chest x-ray : CT angio chest- 2022  EKG : Echo : 2021  CT Cors- 2021  Stress test: Cardiac Cath :   Activity level:  Sleep Study/ CPAP : Fasting Blood Sugar :      / Checks Blood Sugar -- times a day:    Blood Thinner/ Instructions /Last Dose: ASA / Instructions/ Last Dose :    Xarelto     08/25/23- bmp , cbc and hep fct panel  Hgba1c-6.2 on 08/25/23

## 2023-09-05 NOTE — Patient Instructions (Signed)
 SURGICAL WAITING ROOM VISITATION  Patients having surgery or a procedure may have no more than 2 support people in the waiting area - these visitors may rotate.    Children under the age of 16 must have an adult with them who is not the patient.  Visitors with respiratory illnesses are discouraged from visiting and should remain at home.  If the patient needs to stay at the hospital during part of their recovery, the visitor guidelines for inpatient rooms apply. Pre-op nurse will coordinate an appropriate time for 1 support person to accompany patient in pre-op.  This support person may not rotate.    Please refer to the [facility] website for the visitor guidelines for Inpatients (after your surgery is over and you are in a regular room).       Your procedure is scheduled on:  09/25/2023    Report to [facility] Main Entrance    Report to admitting at  0730 AM   Call this number if you have problems the morning of surgery [phone]   Do not eat food :After Midnight.   After Midnight you may have the following liquids until __ 0630____ AM  DAY OF SURGERY  Water Non-Citrus Juices (without pulp, NO RED-Apple, White grape, White cranberry) Black Coffee (NO MILK/CREAM OR CREAMERS, sugar ok)  Clear Tea (NO MILK/CREAM OR CREAMERS, sugar ok) regular and decaf                             Plain Jell-O (NO RED)                                           Fruit ices (not with fruit pulp, NO RED)                                     Popsicles (NO RED)                                                               Sports drinks like Gatorade (NO RED)                   The day of surgery:  Drink ONE (1) Pre-Surgery Clear Ensure or G2 at  0630AM  ( have completed by ) the morning of surgery. Drink in one sitting. Do not sip.  This drink was given to you during your hospital  pre-op appointment visit. Nothing else to drink after completing the  Pre-Surgery Clear Ensure or G2.           If you have questions, please contact your surgeon's office.       Oral Hygiene is also important to reduce your risk of infection.                                    Remember - BRUSH YOUR TEETH THE MORNING OF SURGERY WITH YOUR REGULAR TOOTHPASTE  DENTURES WILL BE REMOVED PRIOR TO SURGERY PLEASE DO  NOT APPLY Poly grip OR ADHESIVES!!!   Do NOT smoke after Midnight   Stop all vitamins and herbal supplements 7 days before surgery.   Take these medicines the morning of surgery with A SIP OF WATER:  none   DO NOT TAKE ANY ORAL DIABETIC MEDICATIONS DAY OF YOUR SURGERY  Bring CPAP mask and tubing day of surgery.                              You may not have any metal on your body including hair pins, jewelry, and body piercing             Do not wear make-up, lotions, powders, perfumes/cologne, or deodorant  Do not wear nail polish including gel and S&S, artificial/acrylic nails, or any other type of covering on natural nails including finger and toenails. If you have artificial nails, gel coating, etc. that needs to be removed by a nail salon please have this removed prior to surgery or surgery may need to be canceled/ delayed if the surgeon/ anesthesia feels like they are unable to be safely monitored.   Do not shave  48 hours prior to surgery.               Men may shave face and neck.   Do not bring valuables to the hospital. [facility] IS NOT             RESPONSIBLE   FOR VALUABLES.   Contacts, glasses, dentures or bridgework may not be worn into surgery.   Bring small overnight bag day of surgery.   DO NOT BRING YOUR HOME MEDICATIONS TO THE HOSPITAL. PHARMACY WILL DISPENSE MEDICATIONS LISTED ON YOUR MEDICATION LIST TO YOU DURING YOUR ADMISSION IN THE HOSPITAL!    Patients discharged on the day of surgery will not be allowed to drive home.  Someone NEEDS to stay with you for the first 24 hours after anesthesia.   Special Instructions: Bring a copy of your healthcare  power of attorney and living will documents the day of surgery if you haven't scanned them before.              Please read over the following fact sheets you were given: IF YOU HAVE QUESTIONS ABOUT YOUR PRE-OP INSTRUCTIONS PLEASE CALL [phone]   If you received a COVID test during your pre-op visit  it is requested that you wear a mask when out in public, stay away from anyone that may not be feeling well and notify your surgeon if you develop symptoms. If you test positive for Covid or have been in contact with anyone that has tested positive in the last 10 days please notify you surgeon.    [facility] - Preparing for Surgery Before surgery, you can play an important role.  Because skin is not sterile, your skin needs to be as free of germs as possible.  You can reduce the number of germs on your skin by washing with CHG (chlorahexidine gluconate) soap before surgery.  CHG is an antiseptic cleaner which kills germs and bonds with the skin to continue killing germs even after washing. Please DO NOT use if you have an allergy to CHG or antibacterial soaps.  If your skin becomes reddened/irritated stop using the CHG and inform your nurse when you arrive at Short Stay. Do not shave (including legs and underarms) for at least 48 hours prior to the first CHG shower.  You may shave your face/neck. Please follow these instructions carefully:  1.  Shower with CHG Soap the night before surgery and the  morning of Surgery.  2.  If you choose to wash your hair, wash your hair first as usual with your  normal  shampoo.  3.  After you shampoo, rinse your hair and body thoroughly to remove the  shampoo.                           4.  Use CHG as you would any other liquid soap.  You can apply chg directly  to the skin and wash                       Gently with a scrungie or clean washcloth.  5.  Apply the CHG Soap to your body ONLY FROM THE NECK DOWN.   Do not use on face/ open                           Wound  or open sores. Avoid contact with eyes, ears mouth and genitals (private parts).                       Wash face,  Genitals (private parts) with your normal soap.             6.  Wash thoroughly, paying special attention to the area where your surgery  will be performed.  7.  Thoroughly rinse your body with warm water from the neck down.  8.  DO NOT shower/wash with your normal soap after using and rinsing off  the CHG Soap.                9.  Pat yourself dry with a clean towel.            10.  Wear clean pajamas.            11.  Place clean sheets on your bed the night of your first shower and do not  sleep with pets. Day of Surgery : Do not apply any lotions/deodorants the morning of surgery.  Please wear clean clothes to the hospital/surgery center.  FAILURE TO FOLLOW THESE INSTRUCTIONS MAY RESULT IN THE CANCELLATION OF YOUR SURGERY PATIENT SIGNATURE_________________________________  NURSE SIGNATURE__________________________________  ________________________________________________________________________

## 2023-09-06 ENCOUNTER — Encounter (HOSPITAL_COMMUNITY)
Admission: RE | Admit: 2023-09-06 | Discharge: 2023-09-06 | Disposition: A | Discharge status: Home / Self Care | Source: Ambulatory Visit | Attending: Surgery | Admitting: Surgery

## 2023-09-06 ENCOUNTER — Encounter (HOSPITAL_COMMUNITY): Payer: Self-pay

## 2023-09-06 ENCOUNTER — Other Ambulatory Visit: Payer: Self-pay

## 2023-09-06 VITALS — BP 116/84 | HR 68 | Temp 98.5°F | Resp 16 | Ht 70.0 in | Wt 280.0 lb

## 2023-09-06 DIAGNOSIS — Z01812 Encounter for preprocedural laboratory examination: Secondary | ICD-10-CM | POA: Diagnosis present

## 2023-09-06 DIAGNOSIS — Z01818 Encounter for other preprocedural examination: Secondary | ICD-10-CM | POA: Diagnosis not present

## 2023-09-06 DIAGNOSIS — Z01810 Encounter for preprocedural cardiovascular examination: Secondary | ICD-10-CM | POA: Diagnosis present

## 2023-09-06 HISTORY — DX: Personal history of urinary calculi: Z87.442

## 2023-09-06 HISTORY — DX: Prediabetes: R73.03

## 2023-09-06 LAB — BASIC METABOLIC PANEL WITH GFR
Anion gap: 10 (ref 5–15)
BUN: 17 mg/dL (ref 6–20)
CO2: 25 mmol/L (ref 22–32)
Calcium: 9.1 mg/dL (ref 8.9–10.3)
Chloride: 104 mmol/L (ref 98–111)
Creatinine, Ser: 1.11 mg/dL (ref 0.61–1.24)
GFR, Estimated: >60 mL/min (ref >60)
Glucose, Bld: 101 mg/dL — ABNORMAL HIGH (ref 70–99)
Potassium: 4.2 mmol/L (ref 3.5–5.1)
Sodium: 139 mmol/L (ref 135–145)

## 2023-09-06 LAB — CBC
HCT: 48.1 % (ref 39.0–52.0)
Hemoglobin: 16.3 g/dL (ref 13.0–17.0)
MCH: 29.7 pg (ref 26.0–34.0)
MCHC: 33.9 g/dL (ref 30.0–36.0)
MCV: 87.8 fL (ref 80.0–100.0)
Platelets: 238 10*3/uL (ref 150–400)
RBC: 5.48 MIL/uL (ref 4.22–5.81)
RDW: 12.3 % (ref 11.5–15.5)
WBC: 6.3 10*3/uL (ref 4.0–10.5)
nRBC: 0 % (ref 0.0–0.2)

## 2023-09-25 ENCOUNTER — Encounter (HOSPITAL_COMMUNITY)
Admission: RE | Disposition: A | Discharge status: Home / Self Care | Payer: Self-pay | Source: Ambulatory Visit | Attending: Surgery

## 2023-09-25 ENCOUNTER — Other Ambulatory Visit: Payer: Self-pay

## 2023-09-25 ENCOUNTER — Ambulatory Visit (HOSPITAL_COMMUNITY): Payer: Self-pay | Admitting: Physician Assistant

## 2023-09-25 ENCOUNTER — Ambulatory Visit (HOSPITAL_COMMUNITY)
Admission: RE | Admit: 2023-09-25 | Discharge: 2023-09-25 | Disposition: A | Discharge status: Home / Self Care | Source: Ambulatory Visit | Attending: Surgery | Admitting: Surgery

## 2023-09-25 ENCOUNTER — Encounter (HOSPITAL_COMMUNITY): Payer: Self-pay | Admitting: Surgery

## 2023-09-25 DIAGNOSIS — E66813 Obesity, class 3: Secondary | ICD-10-CM | POA: Diagnosis not present

## 2023-09-25 DIAGNOSIS — K429 Umbilical hernia without obstruction or gangrene: Secondary | ICD-10-CM

## 2023-09-25 DIAGNOSIS — Z6841 Body Mass Index (BMI) 40.0 and over, adult: Secondary | ICD-10-CM

## 2023-09-25 DIAGNOSIS — G4733 Obstructive sleep apnea (adult) (pediatric): Secondary | ICD-10-CM | POA: Diagnosis not present

## 2023-09-25 DIAGNOSIS — Z8711 Personal history of peptic ulcer disease: Secondary | ICD-10-CM | POA: Insufficient documentation

## 2023-09-25 DIAGNOSIS — Z01818 Encounter for other preprocedural examination: Secondary | ICD-10-CM

## 2023-09-25 DIAGNOSIS — Z86718 Personal history of other venous thrombosis and embolism: Secondary | ICD-10-CM | POA: Insufficient documentation

## 2023-09-25 DIAGNOSIS — Z86711 Personal history of pulmonary embolism: Secondary | ICD-10-CM | POA: Insufficient documentation

## 2023-09-25 DIAGNOSIS — G4730 Sleep apnea, unspecified: Secondary | ICD-10-CM | POA: Insufficient documentation

## 2023-09-25 HISTORY — PX: UMBILICAL HERNIA REPAIR: SHX196

## 2023-09-25 SURGERY — REPAIR, HERNIA, UMBILICAL, ADULT
Anesthesia: General

## 2023-09-25 MED ORDER — MIDAZOLAM HCL 2 MG/2ML IJ SOLN
INTRAMUSCULAR | Status: AC
  Filled 2023-09-25: qty 2

## 2023-09-25 MED ORDER — SUGAMMADEX SODIUM 200 MG/2ML IV SOLN
INTRAVENOUS | Status: DC | PRN
  Administered 2023-09-25: 400 mg via INTRAVENOUS

## 2023-09-25 MED ORDER — STERILE WATER FOR IRRIGATION IR SOLN
Status: DC | PRN
  Administered 2023-09-25: 1000 mL

## 2023-09-25 MED ORDER — LACTATED RINGERS IV SOLN: INTRAVENOUS | Status: DC

## 2023-09-25 MED ORDER — CHLORHEXIDINE GLUCONATE 0.12 % MT SOLN
15.0000 mL | Freq: Once | OROMUCOSAL | Status: AC
  Administered 2023-09-25: 15 mL via OROMUCOSAL

## 2023-09-25 MED ORDER — SUCCINYLCHOLINE CHLORIDE 200 MG/10ML IV SOSY
PREFILLED_SYRINGE | INTRAVENOUS | Status: DC | PRN
Start: 2023-09-25 — End: 2023-09-25
  Administered 2023-09-25: 140 mg via INTRAVENOUS

## 2023-09-25 MED ORDER — ORAL CARE MOUTH RINSE: 15.0000 mL | Freq: Once | OROMUCOSAL | Status: AC

## 2023-09-25 MED ORDER — CHLORHEXIDINE GLUCONATE CLOTH 2 % EX PADS: 6.0000 | MEDICATED_PAD | Freq: Once | CUTANEOUS | Status: DC

## 2023-09-25 MED ORDER — ACETAMINOPHEN 500 MG PO TABS
1000.0000 mg | ORAL_TABLET | ORAL | Status: AC
  Administered 2023-09-25: 1000 mg via ORAL
  Filled 2023-09-25: qty 2

## 2023-09-25 MED ORDER — FENTANYL CITRATE PF 50 MCG/ML IJ SOSY
PREFILLED_SYRINGE | INTRAMUSCULAR | Status: AC
Start: 2023-09-25 — End: 2023-09-25
  Filled 2023-09-25: qty 2

## 2023-09-25 MED ORDER — PROPOFOL 10 MG/ML IV BOLUS
INTRAVENOUS | Status: DC | PRN
  Administered 2023-09-25: 180 mg via INTRAVENOUS

## 2023-09-25 MED ORDER — FENTANYL CITRATE (PF) 250 MCG/5ML IJ SOLN
INTRAMUSCULAR | Status: AC
  Filled 2023-09-25: qty 5

## 2023-09-25 MED ORDER — ONDANSETRON HCL 4 MG/2ML IJ SOLN: 4.0000 mg | Freq: Once | INTRAMUSCULAR | Status: DC | PRN

## 2023-09-25 MED ORDER — BUPIVACAINE-EPINEPHRINE (PF) 0.25% -1:200000 IJ SOLN
INTRAMUSCULAR | Status: AC
  Filled 2023-09-25: qty 30

## 2023-09-25 MED ORDER — LIDOCAINE 2% (20 MG/ML) 5 ML SYRINGE
INTRAMUSCULAR | Status: DC | PRN
  Administered 2023-09-25: 100 mg via INTRAVENOUS

## 2023-09-25 MED ORDER — DEXAMETHASONE SODIUM PHOSPHATE 10 MG/ML IJ SOLN
INTRAMUSCULAR | Status: AC
  Filled 2023-09-25: qty 1

## 2023-09-25 MED ORDER — ROCURONIUM 10MG/ML (10ML) SYRINGE FOR MEDFUSION PUMP - OPTIME
INTRAVENOUS | Status: DC | PRN
  Administered 2023-09-25: 100 mg via INTRAVENOUS

## 2023-09-25 MED ORDER — ONDANSETRON HCL 4 MG/2ML IJ SOLN
INTRAMUSCULAR | Status: DC | PRN
  Administered 2023-09-25: 4 mg via INTRAVENOUS

## 2023-09-25 MED ORDER — MIDAZOLAM HCL 2 MG/2ML IJ SOLN
INTRAMUSCULAR | Status: DC | PRN
  Administered 2023-09-25: 2 mg via INTRAVENOUS

## 2023-09-25 MED ORDER — FENTANYL CITRATE (PF) 100 MCG/2ML IJ SOLN
INTRAMUSCULAR | Status: AC
  Filled 2023-09-25: qty 2

## 2023-09-25 MED ORDER — LIDOCAINE HCL (PF) 2 % IJ SOLN
INTRAMUSCULAR | Status: AC
  Filled 2023-09-25: qty 5

## 2023-09-25 MED ORDER — ONDANSETRON HCL 4 MG/2ML IJ SOLN
INTRAMUSCULAR | Status: AC
  Filled 2023-09-25: qty 2

## 2023-09-25 MED ORDER — OXYCODONE-ACETAMINOPHEN 5-325 MG PO TABS: 1.0000 | ORAL_TABLET | ORAL | 0 refills | Status: DC | PRN

## 2023-09-25 MED ORDER — FENTANYL CITRATE (PF) 250 MCG/5ML IJ SOLN
INTRAMUSCULAR | Status: DC | PRN
  Administered 2023-09-25 (×5): 50 ug via INTRAVENOUS
  Administered 2023-09-25: 100 ug via INTRAVENOUS

## 2023-09-25 MED ORDER — AMISULPRIDE (ANTIEMETIC) 5 MG/2ML IV SOLN: 10.0000 mg | Freq: Once | INTRAVENOUS | Status: DC | PRN

## 2023-09-25 MED ORDER — FENTANYL CITRATE PF 50 MCG/ML IJ SOSY
25.0000 ug | PREFILLED_SYRINGE | INTRAMUSCULAR | Status: DC | PRN
  Administered 2023-09-25 (×2): 50 ug via INTRAVENOUS

## 2023-09-25 MED ORDER — CEFAZOLIN SODIUM-DEXTROSE 3-4 GM/150ML-% IV SOLN
3.0000 g | INTRAVENOUS | Status: AC
  Administered 2023-09-25: 3 g via INTRAVENOUS
  Filled 2023-09-25: qty 150

## 2023-09-25 MED ORDER — PROPOFOL 10 MG/ML IV BOLUS
INTRAVENOUS | Status: AC
  Filled 2023-09-25: qty 20

## 2023-09-25 MED ORDER — GABAPENTIN 300 MG PO CAPS
300.0000 mg | ORAL_CAPSULE | ORAL | Status: AC
  Administered 2023-09-25: 300 mg via ORAL
  Filled 2023-09-25: qty 1

## 2023-09-25 MED ORDER — DEXAMETHASONE SODIUM PHOSPHATE 10 MG/ML IJ SOLN
INTRAMUSCULAR | Status: DC | PRN
  Administered 2023-09-25: 10 mg via INTRAVENOUS

## 2023-09-25 MED ORDER — 0.9 % SODIUM CHLORIDE (POUR BTL) OPTIME
TOPICAL | Status: DC | PRN
Start: 2023-09-25 — End: 2023-09-25
  Administered 2023-09-25: 1000 mL

## 2023-09-25 MED ORDER — BUPIVACAINE-EPINEPHRINE 0.25% -1:200000 IJ SOLN
INTRAMUSCULAR | Status: DC | PRN
  Administered 2023-09-25: 30 mL

## 2023-09-25 SURGICAL SUPPLY — 25 items
COTTONBALL LRG STERILE PKG (GAUZE/BANDAGES/DRESSINGS) ×1 IMPLANT
COVER SURGICAL LIGHT HANDLE (MISCELLANEOUS) ×1 IMPLANT
DERMABOND ADVANCED .7 DNX12 (GAUZE/BANDAGES/DRESSINGS) ×1 IMPLANT
DRAPE LAPAROTOMY T 98X78 PEDS (DRAPES) ×1 IMPLANT
DRSG TEGADERM 4X4.75 (GAUZE/BANDAGES/DRESSINGS) ×1 IMPLANT
ELECT REM PT RETURN 15FT ADLT (MISCELLANEOUS) ×1 IMPLANT
GLOVE BIO SURGEON STRL SZ7.5 (GLOVE) ×1 IMPLANT
GLOVE INDICATOR 8.0 STRL GRN (GLOVE) ×1 IMPLANT
GOWN STRL REUS W/ TWL XL LVL3 (GOWN DISPOSABLE) ×1 IMPLANT
KIT BASIN OR (CUSTOM PROCEDURE TRAY) ×1 IMPLANT
KIT TURNOVER KIT A (KITS) ×1 IMPLANT
MAT PREVALON FULL STRYKER (MISCELLANEOUS) IMPLANT
MESH BARD SOFT 3X6IN (Mesh General) IMPLANT
NDL HYPO 20X1 EYE RM 11 (NEEDLE) ×1 IMPLANT
NEEDLE HYPO 20X1 EYE RM 11 (NEEDLE) ×1 IMPLANT
NS IRRIG 1000ML POUR BTL (IV SOLUTION) ×1 IMPLANT
PACK GENERAL/GYN (CUSTOM PROCEDURE TRAY) ×1 IMPLANT
SPIKE FLUID TRANSFER (MISCELLANEOUS) ×1 IMPLANT
SUT MNCRL AB 4-0 PS2 18 (SUTURE) ×1 IMPLANT
SUT NOVA NAB GS-21 0 18 T12 DT (SUTURE) IMPLANT
SUT PDS AB 0 CT1 36 (SUTURE) IMPLANT
SUT PDS AB 2-0 CT2 27 (SUTURE) IMPLANT
SUT VIC AB 3-0 SH 8-18 (SUTURE) ×1 IMPLANT
SYR CONTROL 10ML LL (SYRINGE) ×1 IMPLANT
TOWEL OR 17X26 10 PK STRL BLUE (TOWEL DISPOSABLE) ×1 IMPLANT

## 2023-09-25 NOTE — Anesthesia Preprocedure Evaluation (Signed)
 Anesthesia Evaluation  Patient identified by MRN, date of birth, ID band Patient awake    Reviewed: Allergy & Precautions, NPO status , Patient's Chart, lab work & pertinent test results  Airway Mallampati: II  TM Distance: >3 FB Neck ROM: Full    Dental  (+) Teeth Intact, Dental Advisory Given   Pulmonary sleep apnea and Continuous Positive Airway Pressure Ventilation , PE   Pulmonary exam normal breath sounds clear to auscultation       Cardiovascular + DVT  Normal cardiovascular exam Rhythm:Regular Rate:Normal     Neuro/Psych negative neurological ROS     GI/Hepatic Neg liver ROS, PUD,,, Umbilical hernia    Endo/Other    Class 3 obesity  Renal/GU negative Renal ROS     Musculoskeletal negative musculoskeletal ROS (+)    Abdominal   Peds  Hematology  (+) Blood dyscrasia (Xarelto )   Anesthesia Other Findings Day of surgery medications reviewed with the patient.  Reproductive/Obstetrics                              Anesthesia Physical Anesthesia Plan  ASA: 3  Anesthesia Plan: General   Post-op Pain Management: Tylenol  PO (pre-op)* and Toradol  IV (intra-op)*   Induction: Intravenous  PONV Risk Score and Plan: 3 and Midazolam , Dexamethasone  and Ondansetron   Airway Management Planned: Oral ETT  Additional Equipment:   Intra-op Plan:   Post-operative Plan: Extubation in OR  Informed Consent: I have reviewed the patients History and Physical, chart, labs and discussed the procedure including the risks, benefits and alternatives for the proposed anesthesia with the patient or authorized representative who has indicated his/her understanding and acceptance.     Dental advisory given  Plan Discussed with: CRNA  Anesthesia Plan Comments:         Anesthesia Quick Evaluation

## 2023-09-25 NOTE — Op Note (Signed)
   Patient: ABDEL EFFINGER ([birth year], [id number])  Date of Surgery: 09/25/2023  Preoperative Diagnosis: UMBILICAL HERNIA 2cm x 2cm based on exam  Postoperative Diagnosis: UMBILICAL HERNIA 2cm x 2cm   Surgical Procedure: Umbilical hernia repair with mesh, 2cm x 2cm   Operative Team Members:  Surgeons and Role:    * Domanique Luckett, Deward PARAS, MD - Primary   Anesthesiologist: Corinne Garnette BRAVO, MD CRNA: Nanci Riis, CRNA   Anesthesia: General   Fluids:  No intake/output data recorded.  Complications: * No complications entered in OR log *  Drains:  none   Specimen: * No specimens in log *   Disposition:  PACU - hemodynamically stable.  Plan of Care: Discharge to home after PACU    Indications for Procedure: TYR FRANCA is a 53 y.o. male who presented with an umbilical hernia.  I recommended open repair with mesh.  We discussed the procedure, its risks, benefits and alternatives and the patient granted consent to proceed  Findings:  Hernia Location: umbilical Hernia Size:  2 x 2 cm  Mesh Size &Type:  7.5 x 7.5 cm Bard Soft Mesh Position: Preperitoneal  Description of Procedure:  The patient was positioned supine, padded and secured to the bed.  The abdomen was widely prepped and draped.  A time out procedure was performed.   A curvilinear incision was made below the umbilicus and dissection was carried down through the subcutaneous tissue to the level of the fascia.  The umbilical stalk was encircled and the hernia sac amputated off the umbilical skin.  The hernia defect was measured as 2 cm wide by 2 cm in vertical dimension.     An umbilical hernia repair with mesh was performed.  The hernia sac was scored along the perimeter of the fascial defect, entering the pre peritoneal plane.  A wide pre peritoneal dissection was performed creating a space for mesh placement.  A piece of Bard Soft was opened and trimmed to 7.5 x 7.5 cm and deployed into the pre peritoneal space.   The mesh laid in taut position in the pre peritoneal plane and did not require fixation.  The space was irrigated with normal saline.  The fascial defect was closed horizontally, overtop the mesh with figure of eight 0 PDS suture.  The umbilical skin was tacked down to the fascial repair with vicryl suture.  The skin was closed with 4-0 Monocryl subcuticular suture and skin glue.  A small gauze pressure dressing was placed into the umbilicus and covered with a Tegaderm dressing.   Deward Foy, MD General, Bariatric, & Minimally Invasive Surgery [facility], [place]

## 2023-09-25 NOTE — Transfer of Care (Signed)
 Immediate Anesthesia Transfer of Care Note  Patient: Scott Mcconnell Heart  Procedure(s) Performed: REPAIR, HERNIA, UMBILICAL, ADULT  Patient Location: PACU  Anesthesia Type:General  Level of Consciousness: drowsy and patient cooperative  Airway & Oxygen Therapy: Patient Spontanous Breathing  Post-op Assessment: Report given to RN and Post -op Vital signs reviewed and stable  Post vital signs: Reviewed and stable  Last Vitals:  Vitals Value Taken Time  BP    Temp    Pulse 79 09/25/23 11:00  Resp 12 09/25/23 11:00  SpO2 99 % 09/25/23 11:00  Vitals shown include unfiled device data.  Last Pain:  Vitals:   09/25/23 0823  TempSrc:   PainSc: 0-No pain      Patients Stated Pain Goal: 5 (09/25/23 0818)  Complications: No notable events documented.

## 2023-09-25 NOTE — Anesthesia Procedure Notes (Signed)
 Procedure Name: Intubation Date/Time: 09/25/2023 9:45 AM  Performed by: Nanci Riis, CRNAPre-anesthesia Checklist: Patient identified, Emergency Drugs available, Suction available, Patient being monitored and Timeout performed Patient Re-evaluated:Patient Re-evaluated prior to induction Oxygen Delivery Method: Circle system utilized Preoxygenation: Pre-oxygenation with 100% oxygen Induction Type: IV induction Ventilation: Mask ventilation without difficulty Laryngoscope Size: Glidescope and 4 Grade View: Grade I Tube type: Oral Tube size: 7.5 mm Number of attempts: 1 Airway Equipment and Method: Stylet and Video-laryngoscopy Placement Confirmation: ETT inserted through vocal cords under direct vision, positive ETCO2 and breath sounds checked- equal and bilateral Secured at: 22 cm Tube secured with: Tape Dental Injury: Teeth and Oropharynx as per pre-operative assessment

## 2023-09-25 NOTE — H&P (Signed)
 Admitting Physician: Deward PARAS [place] Witz  Service: General Surgery  CC: Umbilical hernia  Subjective   HPI: Scott Mcconnell is an 53 y.o. male who is here for umbilical hernia repair  Past Medical History:  Diagnosis Date   DVT (deep venous thrombosis) (HCC)    History of kidney stones    HYPERLIPIDEMIA 04/01/2009   Peptic ulcer    Pre-diabetes    Pulmonary embolism (HCC)    Sleep apnea    CPAP    Past Surgical History:  Procedure Laterality Date   COLONOSCOPY  10/15/2021   [facility]   TONSILLECTOMY      Family History  Problem Relation Age of Onset   Hyperlipidemia Mother    Breast cancer Mother 22       Bilateral Breast Cancer   Clotting disorder Mother    Skin cancer Mother    Alcohol abuse Father    Hyperlipidemia Father    Hypertension Father    Heart disease Father 92       CAD   Diabetes Father        grandfather   Clotting disorder Father    Skin cancer Father    Hypothyroidism Sister    Breast cancer Sister    Brain cancer Maternal Aunt    Melanoma Maternal Aunt    Alcohol abuse Paternal Uncle    Heart disease Maternal Grandmother    Dementia Maternal Grandmother    Heart attack Maternal Grandfather        deceased in 47's   Colon cancer Paternal Grandmother    Alcohol abuse Paternal Grandfather    Prostate cancer Paternal Grandfather    Colon polyps Neg Hx    Esophageal cancer Neg Hx    Rectal cancer Neg Hx    Stomach cancer Neg Hx     Social:  reports that he has never smoked. He has never been exposed to tobacco smoke. He has never used smokeless tobacco. He reports that he does not drink alcohol and does not use drugs.  Allergies:  Allergies  Allergen Reactions   Ibuprofen     Due to Blood thinners   Penicillins     As a child    Medications: Current Outpatient Medications  Medication Instructions   acetaminophen  (TYLENOL ) 500 mg, Every 6 hours PRN   atorvastatin  (LIPITOR) 40 mg, Oral, Daily   sildenafil  (VIAGRA ) 100 MG  tablet TAKE 0.5-1 TABLETS BY MOUTH DAILY AS NEEDED FOR ERECTILE DYSFUNCTION.   Xarelto  20 mg, Oral, Daily with supper    ROS - all of the below systems have been reviewed with the patient and positives are indicated with bold text General: chills, fever or night sweats Eyes: blurry vision or double vision ENT: epistaxis or sore throat Allergy/Immunology: itchy/watery eyes or nasal congestion Hematologic/Lymphatic: bleeding problems, blood clots or swollen lymph nodes Endocrine: temperature intolerance or unexpected weight changes Breast: new or changing breast lumps or nipple discharge Resp: cough, shortness of breath, or wheezing CV: chest pain or dyspnea on exertion GI: as per HPI GU: dysuria, trouble voiding, or hematuria MSK: joint pain or joint stiffness Neuro: TIA or stroke symptoms Derm: pruritus and skin lesion changes Psych: anxiety and depression  Objective   PE Blood pressure 128/80, pulse 76, temperature 98.3 F (36.8 C), temperature source Oral, resp. rate 16, height 5' 10 (1.778 m), weight 127 kg, SpO2 95%. Constitutional: NAD; conversant; no deformities Eyes: Moist conjunctiva; no lid lag; anicteric; PERRL Neck: Trachea midline; no thyromegaly Lungs: Normal  respiratory effort; no tactile fremitus CV: RRR; no palpable thrills; no pitting edema GI: Abd umbilical hernia ~2cm x 2cm; no palpable hepatosplenomegaly MSK: Normal range of motion of extremities; no clubbing/cyanosis Psychiatric: Appropriate affect; alert and oriented x3 Lymphatic: No palpable cervical or axillary lymphadenopathy  No results found for this or any previous visit (from the past 24 hours).  Imaging Orders  No imaging studies ordered today     Assessment and Plan   Scott Mcconnell is an 53 y.o. male with a 2cm x 2cm umbilical hernia.  I recommended open umbilical hernia repair with mesh.  We discussed the procedure, its risks, benefits and alternatives and the patient granted consent to  proceed.  Will proceed as scheduled.    Deward JINNY Foy, MD  [facility] Use [email or web address] to contact on call provider

## 2023-09-25 NOTE — Discharge Instructions (Signed)
 VENTRAL HERNIA REPAIR POST OPERATIVE INSTRUCTIONS  Thinking Clearly  The anesthesia may cause you to feel different for 1 or 2 days. Do not drive, drink alcohol, or make any big decisions for at least 2 days.  Nutrition When you wake up, you will be able to drink small amounts of liquid. If you do not feel sick, you can slowly advance your diet to regular foods. Continue to drink lots of fluids, usually about 8 to 10 glasses per day. Eat a high-fiber diet so you don't strain during bowel movements. High-Fiber Foods Foods high in fiber include beans, bran cereals and whole-grain breads, peas, dried fruit (figs, apricots, and dates), raspberries, blackberries, strawberries, sweet corn, broccoli, baked potatoes with skin, plums, pears, apples, greens, and nuts. Activity Slowly increase your activity. Be sure to get up and walk every hour or so to prevent blood clots. No heavy lifting or strenuous activity for 4 weeks following surgery to prevent hernias at your incision sites or recurrence of your hernia. It is normal to feel tired. You may need more sleep than usual.  Get your rest but make sure to get up and move around frequently to prevent blood clots and pneumonia.  Work and Return to [employer] can go back to work when you feel well enough. Discuss the timing with your surgeon. You can usually go back to school or work 1 week or less after an laparoscopic or an open repair. If your work requires heavy lifting or strenuous activity you need to be placed on light duty for 4 weeks following surgery. You can return to gym class, sports or other physical activities 4 weeks after surgery.  Wound Care You may experience significant bruising throughout the abdominal wall that may track down into the groin including into the scrotum in males.  Rest, elevating the groin and scrotum above the level of the heart, ice and compression with tight fitting underwear or an abdominal binder can help.   Always wash your hands before and after touching near your incision site. Do not soak in a bathtub until cleared at your follow up appointment. You may take a shower 24 hours after surgery. A small amount of drainage from the incision is normal. If the drainage is thick and yellow or the site is red, you may have an infection, so call your surgeon. If you have a drain in one of your incisions, it will be taken out in office when the drainage stops. Steri-Strips will fall off in 7 to 10 days or they will be removed during your first office visit. If you have dermabond glue covering over the incision, allow the glue to flake off on its own. Protect the new skin, especially from the sun. The sun can burn and cause darker scarring. Your scar will heal in about 4 to 6 weeks and will become softer and continue to fade over the next year.  The cosmetic appearance of the incisions will improve over the course of the first year after surgery. Sensation around your incision will return in a few weeks or months.  Bowel Movements After intestinal surgery, you may have loose watery stools for several days. If watery diarrhea lasts longer than 3 days, contact your surgeon. Pain medication (narcotics) can cause constipation. Increase the fiber in your diet with high-fiber foods if you are constipated. You can take an over the counter stool softener like Colace to avoid constipation.  Additional over the counter medications can also be used  if Colace isn't sufficient (for example, Milk of Magnesia or Miralax).  Pain The amount of pain is different for each person. Some people need only 1 to 3 doses of pain control medication, while others need more. Take alternating doses of tylenol and ibuprofen around the clock for the first five days following surgery.  This will provide a baseline of pain control and help with inflammation.  Take the narcotic pain medication in addition if needed for severe pain.  Contact  Your Surgeon at [phone], if you have: Pain that will not go away Pain that gets worse A fever of more than 101F (38.3C) Repeated vomiting Swelling, redness, bleeding, or bad-smelling drainage from your wound site Strong abdominal pain No bowel movement or unable to pass gas for 3 days Watery diarrhea lasting longer than 3 days  Pain Control The goal of pain control is to minimize pain, keep you moving and help you heal. Your surgical team will work with you on your pain plan. Most often a combination of therapies and medications are used to control your pain. You may also be given medication (local anesthetic) at the surgical site. This may help control your pain for several days. Extreme pain puts extra stress on your body at a time when your body needs to focus on healing. Do not wait until your pain has reached a level "10" or is unbearable before telling your doctor or nurse. It is much easier to control pain before it becomes severe. Following a laparoscopic procedure, pain is sometimes felt in the shoulder. This is due to the gas inserted into your abdomen during the procedure. Moving and walking helps to decrease the gas and the right shoulder pain.  Use the guide below for ways to manage your post-operative pain. Learn more by going to facs.org/safepaincontrol.  How Intense Is My Pain Common Therapies to Feel Better       I hardly notice my pain, and it does not interfere with my activities.  I notice my pain and it distracts me, but I can still do activities (sitting up, walking, standing).  Non-Medication Therapies  Ice (in a bag, applied over clothing at the surgical site), elevation, rest, meditation, massage, distraction (music, TV, play) walking and mild exercise Splinting the abdomen with pillows +  Non-Opioid Medications Acetaminophen (Tylenol) Non-steroidal anti-inflammatory drugs (NSAIDS) Aspirin, Ibuprofen (Motrin, Advil) Naproxen (Aleve) Take these as  needed, when you feel pain. Both acetaminophen and NSAIDs help to decrease pain and swelling (inflammation).      My pain is hard to ignore and is more noticeable even when I rest.  My pain interferes with my usual activities.  Non-Medication Therapies  +  Non-Opioid medications  Take on a regular schedule (around-the-clock) instead of as needed. (For example, Tylenol every 6 hours at 9:00 am, 3:00 pm, 9:00 pm, 3:00 am and Motrin every 6 hours at 12:00 am, 6:00 am, 12:00 pm, 6:00 pm)         I am focused on my pain, and I am not doing my daily activities.  I am groaning in pain, and I cannot sleep. I am unable to do anything.  My pain is as bad as it could be, and nothing else matters.  Non-Medication Therapies  +  Around-the-Clock Non-Opioid Medications  +  Short-acting opioids  Opioids should be used with other medications to manage severe pain. Opioids block pain and give a feeling of euphoria (feel high). Addiction, a serious side effect of opioids, is  rare with short-term (a few days) use.  Examples of short-acting opioids include: Tramadol (Ultram), Hydrocodone (Norco, Vicodin), Hydromorphone (Dilaudid), Oxycodone (Oxycontin)     The above directions have been adapted from the [employer] of [employer] Surgical Patient Education Program.  Please refer to the ACS website if needed: http://kaiser.net/   Ivar Drape, MD [facility], [place] [address], [address], [place], [place]  [address] ?  [address] [address], [place], [place]   [address] [phone] ? [phone] ? FAX [phone] Web site: [email or web address]

## 2023-09-25 NOTE — Anesthesia Postprocedure Evaluation (Signed)
 Anesthesia Post Note  Patient: Scott Mcconnell Heart  Procedure(s) Performed: REPAIR, HERNIA, UMBILICAL, ADULT     Patient location during evaluation: PACU Anesthesia Type: General Level of consciousness: awake and alert Pain management: pain level controlled Vital Signs Assessment: post-procedure vital signs reviewed and stable Respiratory status: spontaneous breathing, nonlabored ventilation, respiratory function stable and patient connected to nasal cannula oxygen Cardiovascular status: blood pressure returned to baseline and stable Postop Assessment: no apparent nausea or vomiting Anesthetic complications: no   No notable events documented.  Last Vitals:  Vitals:   09/25/23 1230 09/25/23 1300  BP: 108/62   Pulse: 72   Resp: 11 14  Temp:    SpO2: 97%     Last Pain:  Vitals:   09/25/23 1300  TempSrc:   PainSc: 4                  Garnette FORBES Skillern

## 2023-09-26 ENCOUNTER — Encounter (HOSPITAL_COMMUNITY): Payer: Self-pay | Admitting: Surgery

## 2023-11-09 ENCOUNTER — Other Ambulatory Visit: Payer: Self-pay | Admitting: Hematology & Oncology

## 2023-11-09 DIAGNOSIS — I824Y9 Acute embolism and thrombosis of unspecified deep veins of unspecified proximal lower extremity: Secondary | ICD-10-CM

## 2023-11-09 DIAGNOSIS — I2694 Multiple subsegmental pulmonary emboli without acute cor pulmonale: Secondary | ICD-10-CM

## 2023-11-22 ENCOUNTER — Ambulatory Visit (INDEPENDENT_AMBULATORY_CARE_PROVIDER_SITE_OTHER): Admitting: Family Medicine

## 2023-11-22 ENCOUNTER — Encounter: Payer: Self-pay | Admitting: Family Medicine

## 2023-11-22 ENCOUNTER — Ambulatory Visit: Payer: Self-pay | Admitting: *Deleted

## 2023-11-22 ENCOUNTER — Ambulatory Visit: Admitting: Internal Medicine

## 2023-11-22 VITALS — BP 126/78 | HR 66 | Resp 16 | Ht 70.0 in | Wt 285.8 lb

## 2023-11-22 DIAGNOSIS — M79671 Pain in right foot: Secondary | ICD-10-CM

## 2023-11-22 DIAGNOSIS — S9031XA Contusion of right foot, initial encounter: Secondary | ICD-10-CM

## 2023-11-22 NOTE — Telephone Encounter (Signed)
 Copied from CRM [id number]. Topic: Clinical - Red Word Triage >> Nov 22, 2023  8:06 AM Mia F wrote: Red Word that prompted transfer to Nurse Triage: Some bruising and some swelling to the right foot. Pt says he does not recall doing anything to it. He says it started on Monday. No pain Reason for Disposition  [1] Swollen foot AND [2] no fever  (Exceptions: Localized bump from bunions, calluses, insect bite, sting.)  Answer Assessment - Initial Assessment Questions 1. ONSET: When did the pain start?      My right foot is swollen and bruised from toes to the heel.   No injuries.  I walk 1-2 miles a day on it.   No pain.    No injuries.   I've blood clots in both legs. 2. LOCATION: Where is the pain located?      No pain   I'm on blood thinners. 3. PAIN: How bad is the pain?    (Scale 1-10; or mild, moderate, severe)     No pain   4. WORK OR EXERCISE: Has there been any recent work or exercise that involved this part of the body?      No injuries 5. CAUSE: What do you think is causing the foot pain?     I have no idea 6. OTHER SYMPTOMS: Do you have any other symptoms? (e.g., leg pain, rash, fever, numbness)     No 7. PREGNANCY: Is there any chance you are pregnant? When was your last menstrual period?     N/A  Protocols used: Foot Pain-A-AH FYI Only or Action Required?: FYI only for provider.  Patient was last seen in primary care on 08/25/2023 by Micheal Wolm ORN, MD.  Called Nurse Triage reporting Foot Swelling.Right foot swollen and bruised from toes to heel.  No pain or injuries.    Symptoms began several days ago.Since Monday morning.  Interventions attempted: Nothing.  Symptoms are: gradually worsening.  Triage Disposition: See Physician Within 24 Hours  Patient/caregiver understands and will follow disposition?: Yes

## 2023-11-22 NOTE — Patient Instructions (Addendum)
 A few things to remember from today's visit:  Right foot pain X ray Friday. Continue elevation above heart level. Hard sole shoe.  If you need refills for medications you take chronically, please call your pharmacy. Do not use My Chart to request refills or for acute issues that need immediate attention. If you send a my chart message, it may take a few days to be addressed, specially if I am not in the office.  Please be sure medication list is accurate. If a new problem present, please set up appointment sooner than planned today.

## 2023-11-22 NOTE — Progress Notes (Signed)
 ACUTE VISIT Chief Complaint  Patient presents with   Foot Swelling   Bleeding/Bruising    Right foot   Discussed the use of AI scribe software for clinical note transcription with the patient, who gave verbal consent to proceed.  History of Present Illness Scott Mcconnell is a 53 year old male with past medical history significant for PE and DVT, OSA, and hyperlipidemia, who is here today complaining of sudden onset of right foot edema and ecchymosis.  He noticed bruising and swelling on his right foot on Monday while working from home. There is no recent trauma or unusual activity that could have caused the bruising. He walks his dog daily for about a mile and a half and notes that he does not have pain during walking. The swelling is intermittent, and there is no associated pain.  He is currently taking Xarelto  for a history of DVT and PE in 2021.  No chest pain, difficulty breathing, palpitations, urinary symptoms, or numbness and tingling in the feet, although he occasionally experiences tingling in both LE/feet, which has been ongoing for a couple of years.  He recalls a previous incident at the end of August or early September when he experienced foot pain after using an exercise bike, but the pain resolved after a few days and he has not used the bike since.   He elevates his foot above heart level after dinner, which he finds helpful in reducing swelling.  Review of Systems  Constitutional:  Negative for activity change, appetite change, chills and fever.  HENT:  Negative for nosebleeds and sore throat.   Respiratory:  Negative for cough and wheezing.   Gastrointestinal:  Negative for abdominal pain, nausea and vomiting.  Genitourinary:  Negative for decreased urine volume, dysuria and hematuria.  Skin:  Negative for rash and wound.  Neurological:  Negative for syncope, weakness and headaches.  See other pertinent positives and negatives in HPI.  Current Outpatient  Medications on File Prior to Visit  Medication Sig Dispense Refill   acetaminophen  (TYLENOL ) 500 MG tablet Take 500 mg by mouth every 6 (six) hours as needed for moderate pain (pain score 4-6).     atorvastatin  (LIPITOR) 40 MG tablet TAKE 1 TABLET BY MOUTH EVERY DAY 90 tablet 1   oxyCODONE -acetaminophen  (PERCOCET) 5-325 MG tablet Take 1 tablet by mouth every 4 (four) hours as needed for severe pain (pain score 7-10). 20 tablet 0   sildenafil  (VIAGRA ) 100 MG tablet TAKE 0.5-1 TABLETS BY MOUTH DAILY AS NEEDED FOR ERECTILE DYSFUNCTION. 6 tablet 3   XARELTO  20 MG TABS tablet TAKE 1 TABLET BY MOUTH DAILY WITH SUPPER 90 tablet 1   No current facility-administered medications on file prior to visit.   Past Medical History:  Diagnosis Date   DVT (deep venous thrombosis) (HCC)    History of kidney stones    HYPERLIPIDEMIA 04/01/2009   Peptic ulcer    Pre-diabetes    Pulmonary embolism (HCC)    Sleep apnea    CPAP   Allergies  Allergen Reactions   Ibuprofen     Due to Blood thinners   Penicillins     As a child   Social History   Socioeconomic History   Marital status: Married    Spouse name: Not on file   Number of children: 0   Years of education: Not on file   Highest education level: Not on file  Occupational History   Occupation: Emergency planning/management officer  Tobacco Use  Smoking status: Never    Passive exposure: Never   Smokeless tobacco: Never  Vaping Use   Vaping status: Never Used  Substance and Sexual Activity   Alcohol use: No   Drug use: No   Sexual activity: Not on file  Other Topics Concern   Not on file  Social History Narrative   2 adopted children   Social Drivers of Health   Financial Resource Strain: Not on file  Food Insecurity: Not on file  Transportation Needs: Not on file  Physical Activity: Not on file  Stress: Not on file  Social Connections: Not on file   Vitals:   11/22/23 1626  BP: 126/78  Pulse: 66  Resp: 16  SpO2: 98%   Body mass index is  41.01 kg/m.  Physical Exam Vitals and nursing note reviewed.  Constitutional:      General: He is not in acute distress.    Appearance: Normal appearance. He is well-developed.  HENT:     Head: Normocephalic and atraumatic.  Eyes:     Conjunctiva/sclera: Conjunctivae normal.  Cardiovascular:     Rate and Rhythm: Normal rate and regular rhythm.     Pulses:          Dorsalis pedis pulses are 2+ on the right side and 2+ on the left side.     Heart sounds: No murmur heard. Pulmonary:     Effort: Pulmonary effort is normal. No respiratory distress.     Breath sounds: Normal breath sounds.  Musculoskeletal:     Right foot: Normal capillary refill. Swelling and tenderness present. Normal pulse.       Feet:  Skin:    General: Skin is warm.     Findings: Rash present. No erythema.  Neurological:     General: No focal deficit present.     Mental Status: He is alert and oriented to person, place, and time.  Psychiatric:        Mood and Affect: Mood and affect normal.      ASSESSMENT AND PLAN:  [name] was seen today for right foot edema and ecchymosis.  Right foot pain Mild and exacerbated with palpation of some areas , specially medial aspect (metatarsus). Today we do not have X ray services, offer going to [facility] facility but he prefers to do it here in the office , Friday.  -     DG Foot Complete Right; Future  Traumatic ecchymosis of right foot, initial encounter On chronic anticoagulation, Xarelto . No known hx of trauma but based clinical findings it seems like he had some type of injury that cause ecchymosis and edema he is reporting and noted today. Concerned about possible fracture. He is coming back Friday to have X ray done, ordered stat, so we can obtain report faster. In the meantime he was instructed to wear shoes with hard sole and continue elevation above heart level.  -     DG Foot Complete Right; Future  Return if symptoms worsen or fail to improve, for X  ray Friday..  Royer Cristobal G. [place], MD  [facility]. [place] office.

## 2023-11-24 ENCOUNTER — Other Ambulatory Visit

## 2023-11-24 ENCOUNTER — Ambulatory Visit

## 2023-11-24 DIAGNOSIS — S9031XA Contusion of right foot, initial encounter: Secondary | ICD-10-CM

## 2023-11-24 DIAGNOSIS — M79671 Pain in right foot: Secondary | ICD-10-CM

## 2023-11-25 ENCOUNTER — Ambulatory Visit: Payer: Self-pay | Admitting: Family Medicine

## 2024-02-26 ENCOUNTER — Ambulatory Visit: Admitting: Family Medicine

## 2024-02-26 ENCOUNTER — Encounter: Payer: Self-pay | Admitting: Family Medicine

## 2024-02-26 VITALS — BP 100/60 | HR 70 | Temp 98.3°F | Wt 282.2 lb

## 2024-02-26 DIAGNOSIS — R739 Hyperglycemia, unspecified: Secondary | ICD-10-CM | POA: Diagnosis not present

## 2024-02-26 LAB — POCT GLYCOSYLATED HEMOGLOBIN (HGB A1C): Hemoglobin A1C: 6.1 % — AB (ref 4.0–5.6)

## 2024-02-26 NOTE — Progress Notes (Signed)
 "  Established Patient Office Visit  Subjective   Patient ID: Scott Mcconnell, male    DOB: Jan 19, 1971  Age: 53 y.o. MRN: [id number]  Chief Complaint  Patient presents with   Medical Management of Chronic Issues    HPI   Beryl is seen for follow-up regarding hyperglycemia.  He had physical last June and had A1c of 6.2%.  He has been exercising about 40 minutes a few days per week.  Does struggle to lose any weight.  Not checking blood sugars regularly.  No polyuria or polydipsia.  He has hyperlipidemia treated with atorvastatin .  He has history of pulmonary emboli and takes Xarelto  for that.  Generally doing well.  No recent chest pains or dyspnea.  Past Medical History:  Diagnosis Date   DVT (deep venous thrombosis) (HCC)    History of kidney stones    HYPERLIPIDEMIA 04/01/2009   Peptic ulcer    Pre-diabetes    Pulmonary embolism (HCC)    Sleep apnea    CPAP   Past Surgical History:  Procedure Laterality Date   COLONOSCOPY  10/15/2021   [facility]   TONSILLECTOMY     UMBILICAL HERNIA REPAIR N/A 09/25/2023   Procedure: REPAIR, HERNIA, UMBILICAL, ADULT;  Surgeon: Lyndel Deward PARAS, MD;  Location: [facility];  Service: General;  Laterality: N/A;    reports that he has never smoked. He has never been exposed to tobacco smoke. He has never used smokeless tobacco. He reports that he does not drink alcohol and does not use drugs. family history includes Alcohol abuse in his father, paternal grandfather, and paternal uncle; Brain cancer in his maternal aunt; Breast cancer in his sister; Breast cancer (age of onset: 80) in his mother; Clotting disorder in his father and mother; Colon cancer in his paternal grandmother; Dementia in his maternal grandmother; Diabetes in his father; Heart attack in his maternal grandfather; Heart disease in his maternal grandmother; Heart disease (age of onset: 45) in his father; Hyperlipidemia in his father and mother; Hypertension in his father; Hypothyroidism  in his sister; Melanoma in his maternal aunt; Prostate cancer in his paternal grandfather; Skin cancer in his father and mother. Allergies[1]  Review of Systems  Constitutional:  Negative for malaise/fatigue.  Eyes:  Negative for blurred vision.  Respiratory:  Negative for shortness of breath.   Cardiovascular:  Negative for chest pain.  Neurological:  Negative for dizziness, weakness and headaches.      Objective:     BP 100/60   Pulse 70   Temp 98.3 F (36.8 C) (Oral)   Wt 282 lb 3.2 oz (128 kg)   SpO2 95%   BMI 40.49 kg/m    Physical Exam Vitals reviewed.  Constitutional:      General: He is not in acute distress.    Appearance: He is well-developed. He is not ill-appearing.  Eyes:     Pupils: Pupils are equal, round, and reactive to light.  Neck:     Thyroid : No thyromegaly.  Cardiovascular:     Rate and Rhythm: Normal rate and regular rhythm.  Pulmonary:     Effort: Pulmonary effort is normal. No respiratory distress.     Breath sounds: Normal breath sounds. No wheezing or rales.  Musculoskeletal:     Cervical back: Neck supple.  Neurological:     Mental Status: He is alert and oriented to person, place, and time.      Results for orders placed or performed in visit on 02/26/24  POC HgB  A1c  Result Value Ref Range   Hemoglobin A1C 6.1 (A) 4.0 - 5.6 %   HbA1c POC (<> result, manual entry)     HbA1c, POC (prediabetic range)     HbA1c, POC (controlled diabetic range)        The 10-year ASCVD risk score (Arnett DK, et al., 2019) is: 3.2%    Assessment & Plan:   Problem List Items Addressed This Visit   None Visit Diagnoses       Hyperglycemia    -  Primary   Relevant Orders   POC HgB A1c (Completed)     A1c today 6.1 which is stable compared to 6.2% last June.  Continue lower glycemic diet.  Handout given.  Discussed healthy weight loss strategies.  Continue regular exercise habits.  Follow-up in June for physical.  Repeat A1c at that time.   Discussed possible nutritional referral and he will consider at some point  Return in about 6 months (around 08/26/2024).    Wolm Scarlet, MD     [1]  Allergies Allergen Reactions   Ibuprofen     Due to Blood thinners   Penicillins     As a child   "

## 2024-03-16 ENCOUNTER — Other Ambulatory Visit: Payer: Self-pay | Admitting: Family Medicine
